# Patient Record
Sex: Female | Born: 1960
Health system: Southern US, Community
[De-identification: ages and names within clinical notes are randomized; demographics above are authoritative.]

## PROBLEM LIST (undated history)

## (undated) DIAGNOSIS — K5792 Diverticulitis of intestine, part unspecified, without perforation or abscess without bleeding: Secondary | ICD-10-CM

## (undated) DIAGNOSIS — K219 Gastro-esophageal reflux disease without esophagitis: Secondary | ICD-10-CM

## (undated) DIAGNOSIS — K589 Irritable bowel syndrome without diarrhea: Secondary | ICD-10-CM

## (undated) DIAGNOSIS — L508 Other urticaria: Secondary | ICD-10-CM

## (undated) DIAGNOSIS — R091 Pleurisy: Secondary | ICD-10-CM

## (undated) DIAGNOSIS — R569 Unspecified convulsions: Secondary | ICD-10-CM

## (undated) HISTORY — PX: HYSTEROTOMY: SHX1776

## (undated) HISTORY — PX: TIBIA FRACTURE SURGERY: SHX806

## (undated) HISTORY — DX: Other urticaria: L50.8

## (undated) HISTORY — PX: PARTIAL HYSTERECTOMY: SHX80

## (undated) HISTORY — DX: Unspecified convulsions: R56.9

---

## 2009-05-13 ENCOUNTER — Encounter: Payer: Self-pay | Admitting: Gastroenterology

## 2009-05-14 ENCOUNTER — Encounter: Payer: Self-pay | Admitting: Gastroenterology

## 2009-05-14 HISTORY — PX: COLONOSCOPY: SHX174

## 2009-05-14 HISTORY — PX: ESOPHAGOGASTRODUODENOSCOPY: SHX1529

## 2016-05-09 ENCOUNTER — Emergency Department (HOSPITAL_COMMUNITY): Payer: Managed Care, Other (non HMO)

## 2016-05-09 ENCOUNTER — Encounter (HOSPITAL_COMMUNITY): Payer: Self-pay

## 2016-05-09 ENCOUNTER — Emergency Department (HOSPITAL_COMMUNITY)
Admission: EM | Admit: 2016-05-09 | Discharge: 2016-05-09 | Disposition: A | Discharge status: Home / Self Care | Payer: Managed Care, Other (non HMO) | Attending: Emergency Medicine | Admitting: Emergency Medicine

## 2016-05-09 DIAGNOSIS — R1031 Right lower quadrant pain: Secondary | ICD-10-CM | POA: Diagnosis not present

## 2016-05-09 DIAGNOSIS — R109 Unspecified abdominal pain: Secondary | ICD-10-CM

## 2016-05-09 DIAGNOSIS — Z7982 Long term (current) use of aspirin: Secondary | ICD-10-CM | POA: Insufficient documentation

## 2016-05-09 DIAGNOSIS — R1032 Left lower quadrant pain: Secondary | ICD-10-CM | POA: Insufficient documentation

## 2016-05-09 DIAGNOSIS — R05 Cough: Secondary | ICD-10-CM | POA: Insufficient documentation

## 2016-05-09 DIAGNOSIS — Z791 Long term (current) use of non-steroidal anti-inflammatories (NSAID): Secondary | ICD-10-CM | POA: Insufficient documentation

## 2016-05-09 DIAGNOSIS — F17210 Nicotine dependence, cigarettes, uncomplicated: Secondary | ICD-10-CM | POA: Diagnosis not present

## 2016-05-09 HISTORY — DX: Irritable bowel syndrome without diarrhea: K58.9

## 2016-05-09 HISTORY — DX: Gastro-esophageal reflux disease without esophagitis: K21.9

## 2016-05-09 HISTORY — DX: Diverticulitis of intestine, part unspecified, without perforation or abscess without bleeding: K57.92

## 2016-05-09 HISTORY — DX: Pleurisy: R09.1

## 2016-05-09 LAB — COMPREHENSIVE METABOLIC PANEL
ALT: 15 U/L (ref 14–54)
AST: 32 U/L (ref 15–41)
Albumin: 3.6 g/dL (ref 3.5–5.0)
Alkaline Phosphatase: 54 U/L (ref 38–126)
Anion gap: 6 (ref 5–15)
BUN: 11 mg/dL (ref 6–20)
CO2: 28 mmol/L (ref 22–32)
Calcium: 9.3 mg/dL (ref 8.9–10.3)
Chloride: 103 mmol/L (ref 101–111)
Creatinine, Ser: 0.62 mg/dL (ref 0.44–1.00)
GFR calc Af Amer: >60 mL/min (ref >60)
GFR calc non Af Amer: >60 mL/min (ref >60)
Glucose, Bld: 107 mg/dL — ABNORMAL HIGH (ref 65–99)
Potassium: 4.1 mmol/L (ref 3.5–5.1)
Sodium: 137 mmol/L (ref 135–145)
Total Bilirubin: 0.3 mg/dL (ref 0.3–1.2)
Total Protein: 6.6 g/dL (ref 6.5–8.1)

## 2016-05-09 LAB — LIPASE, BLOOD: Lipase: 40 U/L (ref 11–51)

## 2016-05-09 LAB — CBC WITH DIFFERENTIAL/PLATELET
Basophils Absolute: 0.1 10*3/uL (ref 0.0–0.1)
Basophils Relative: 1 %
Eosinophils Absolute: 0.4 10*3/uL (ref 0.0–0.7)
Eosinophils Relative: 5 %
HCT: 35.4 % — ABNORMAL LOW (ref 36.0–46.0)
Hemoglobin: 12 g/dL (ref 12.0–15.0)
Lymphocytes Relative: 39 %
Lymphs Abs: 2.9 10*3/uL (ref 0.7–4.0)
MCH: 29.9 pg (ref 26.0–34.0)
MCHC: 33.9 g/dL (ref 30.0–36.0)
MCV: 88.3 fL (ref 78.0–100.0)
Monocytes Absolute: 0.6 10*3/uL (ref 0.1–1.0)
Monocytes Relative: 9 %
Neutro Abs: 3.5 10*3/uL (ref 1.7–7.7)
Neutrophils Relative %: 46 %
Platelets: 326 10*3/uL (ref 150–400)
RBC: 4.01 MIL/uL (ref 3.87–5.11)
RDW: 13.9 % (ref 11.5–15.5)
WBC: 7.5 10*3/uL (ref 4.0–10.5)

## 2016-05-09 LAB — URINALYSIS, ROUTINE W REFLEX MICROSCOPIC
Bilirubin Urine: NEGATIVE
Glucose, UA: NEGATIVE mg/dL
Hgb urine dipstick: NEGATIVE
Ketones, ur: NEGATIVE mg/dL
Leukocytes, UA: NEGATIVE
Nitrite: NEGATIVE
Protein, ur: NEGATIVE mg/dL
Specific Gravity, Urine: 1.005 (ref 1.005–1.030)
pH: 5 (ref 5.0–8.0)

## 2016-05-09 LAB — I-STAT TROPONIN, ED: Troponin i, poc: 0 ng/mL (ref 0.00–0.08)

## 2016-05-09 LAB — D-DIMER, QUANTITATIVE: D-Dimer, Quant: 0.3 ug/mL-FEU (ref 0.00–0.50)

## 2016-05-09 MED ORDER — IBUPROFEN 800 MG PO TABS: 800.0000 mg | ORAL_TABLET | Freq: Three times a day (TID) | ORAL | 0 refills | Status: DC | PRN

## 2016-05-09 NOTE — ED Triage Notes (Addendum)
Pt reports left side flank pain x one week Feels likecramp/contraction of pain. Marland Kitchen. Has hx of pleusrisy. Intermittent cough. Has difficulty having BM. Denies urinary symptoms. Reports increase belching and reflux

## 2016-05-09 NOTE — ED Notes (Signed)
Pt returned from X-ray.  

## 2016-05-09 NOTE — ED Provider Notes (Signed)
[facility] Provider Note   CSN: [id number] Arrival date & time: 05/09/16  0636     History   Chief Complaint Chief Complaint  Patient presents with  . Flank Pain    HPI Tammy Flores is a 56 y.o. female.  HPI  56 year old female presents with left back/flank pain. She states that it has been present since yesterday morning. She has also had gas and reflux for about one week. However when she woke up yesterday morning her left flank was hurting. She's had a mild cough with mild productive sputum that she does not know the color for about 3 days. No fevers or shortness of breath. She feels like she might have a sinus infection as she's had drainage running down the back of her throat. However does not have anterior chest pain or shortness of breath. Nothing makes the pain better or worse including no pleuritic symptoms or exertional symptoms. The pain feels sharp. She has not noticed a rash. She has not noticed hematuria or dysuria. No nausea, vomiting, diarrhea, or constipation. She has felt a little bit of lower abdominal pain during this time as well. No leg swelling. Prior partial hysterectomy.  Past Medical History:  Diagnosis Date  . Diverticulitis   . GERD (gastroesophageal reflux disease)   . IBS (irritable bowel syndrome)   . Pleurisy     There are no active problems to display for this patient.   Past Surgical History:  Procedure Laterality Date  . HYSTEROTOMY     partial    OB History    Gravida Para Term Preterm AB Living   5 5           SAB TAB Ectopic Multiple Live Births                   Home Medications    Prior to Admission medications   Medication Sig Start Date End Date Taking? Authorizing Provider  aspirin EC 81 MG tablet Take 81 mg by mouth daily.   Yes Historical Provider, MD  cetirizine (EQ ALLERGY RELIEF, CETIRIZINE,) 10 MG tablet Take 10 mg by mouth daily.   Yes Historical Provider, MD  omeprazole (PRILOSEC OTC) 20 MG tablet Take 20 mg  by mouth daily.   Yes Historical Provider, MD  ibuprofen (ADVIL,MOTRIN) 800 MG tablet Take 1 tablet (800 mg total) by mouth every 8 (eight) hours as needed. 05/09/16   Pricilla Loveless, MD    Family History No family history on file.  Social History Social History  Substance Use Topics  . Smoking status: Current Every Day Smoker    Types: Cigarettes  . Smokeless tobacco: Never Used  . Alcohol use No     Allergies   Iodine; Food; and Shellfish allergy   Review of Systems Review of Systems  Constitutional: Negative for fever.  Respiratory: Positive for cough (mild). Negative for shortness of breath.   Cardiovascular: Negative for chest pain and leg swelling.  Gastrointestinal: Positive for abdominal pain. Negative for constipation, diarrhea, nausea and vomiting.  Genitourinary: Positive for flank pain. Negative for dysuria and hematuria.  Musculoskeletal: Positive for back pain.  All other systems reviewed and are negative.    Physical Exam Updated Vital Signs BP 124/84   Pulse 85   Temp 97.7 F (36.5 C) (Oral)   Resp 13   Ht 5\' 4"  (1.626 m)   Wt 180 lb (81.6 kg)   SpO2 95%   BMI 30.90 kg/m   Physical Exam  Constitutional: She is oriented to person, place, and time. She appears well-developed and well-nourished.  HENT:  Head: Normocephalic and atraumatic.  Right Ear: External ear normal.  Left Ear: External ear normal.  Nose: Nose normal.  Eyes: Right eye exhibits no discharge. Left eye exhibits no discharge.  Cardiovascular: Normal rate, regular rhythm and normal heart sounds.   Pulmonary/Chest: Effort normal and breath sounds normal. She has no wheezes. She has no rales.  No rash or tenderness over left lower back/chest  Abdominal: Soft. There is tenderness (mild) in the right lower quadrant, suprapubic area and left lower quadrant. There is no CVA tenderness.  Neurological: She is alert and oriented to person, place, and time.  Skin: Skin is warm and dry.    Nursing note and vitals reviewed.    ED Treatments / Results  Labs (all labs ordered are listed, but only abnormal results are displayed) Labs Reviewed  URINALYSIS, ROUTINE W REFLEX MICROSCOPIC - Abnormal; Notable for the following:       Result Value   Color, Urine STRAW (*)    All other components within normal limits  COMPREHENSIVE METABOLIC PANEL - Abnormal; Notable for the following:    Glucose, Bld 107 (*)    All other components within normal limits  CBC WITH DIFFERENTIAL/PLATELET - Abnormal; Notable for the following:    HCT 35.4 (*)    All other components within normal limits  LIPASE, BLOOD  D-DIMER, QUANTITATIVE (NOT AT [facility])  I-STAT TROPOININ, ED    EKG  EKG Interpretation  Date/Time:  Tuesday May 09 2016 08:06:17 EST Ventricular Rate:  80 PR Interval:    QRS Duration: 83 QT Interval:  398 QTC Calculation: 460 R Axis:   45 Text Interpretation:  Sinus rhythm Short PR interval Low voltage, precordial leads No old tracing to compare Confirmed by Eldena Dede MD, Tayron Hunnell [phone]) on 05/09/2016 8:28:07 AM       Radiology Dg Chest 2 View  Result Date: 05/09/2016 CLINICAL DATA:  Cough, left-sided rib discomfort. EXAM: CHEST  2 VIEW COMPARISON:  None in PACs FINDINGS: The lungs are adequately inflated. There is no focal infiltrate. There is no pleural effusion or pneumothorax. The heart and pulmonary vascularity are normal. The mediastinum is normal in width. There is mild degenerative disc disease at multiple mid and lower thoracic levels. The observed portions of the left ribs are normal. IMPRESSION: There is no acute cardiopulmonary abnormality. The observed portions of the left ribs exhibit no acute abnormalities. Electronically Signed   By: David  [place] M.D.   On: 05/09/2016 08:42   Ct Renal Stone Study  Result Date: 05/09/2016 CLINICAL DATA:  56 year old female with left-sided flank pain for the past week. History of pleurisy, gastroesophageal reflux, irritable  bowel syndrome and diverticulitis. Initial encounter. EXAM: CT ABDOMEN AND PELVIS WITHOUT CONTRAST TECHNIQUE: Multidetector CT imaging of the abdomen and pelvis was performed following the standard protocol without IV contrast. COMPARISON:  None. FINDINGS: Lower chest: Lung bases clear. Hepatobiliary: Taking into account limitation by non contrast imaging, no worrisome mass. Elongated liver spanning over 19.8 cm with fatty infiltration. No calcified gallstones. Pancreas: Taking into account limitation by non contrast imaging, no mass or inflammation. Spleen: Taking into account limitation by non contrast imaging, no mass or enlargement. Adrenals/Urinary Tract: No renal or ureteral obstructing stone or evidence hydronephrosis. Taking into account limitation by non contrast imaging, no renal or adrenal mass. Stomach/Bowel: No extra luminal bowel inflammatory process, free fluid or free air. Sigmoid diverticular with muscular  hypertrophy. Left ovary adjacent to the sigmoid colon. Under distended colon at the level of the hepatic flexure limits evaluation. Under distended stomach without gross abnormality. Vascular/Lymphatic: Mild vascular calcifications without aortic aneurysm. No adenopathy. Reproductive: Post hysterectomy. Other: none. Musculoskeletal: Degenerative changes lower thoracic and lumbar spine most notable lower lumbar region. IMPRESSION: No renal or ureteral obstructing stone or evidence hydronephrosis. No extra luminal bowel inflammatory process, free fluid or free air. Sigmoid diverticular with muscular hypertrophy. Under distended colon at the level of the hepatic flexure limits evaluation. Under distended stomach without gross abnormality. Aortic atherosclerosis. Degenerative changes lower thoracic lumbar spine most notable lower lumbar region. Elongated fatty liver. Electronically Signed   By: Lacy DuverneySteven  Olson M.D.   On: 05/09/2016 09:07    Procedures Procedures (including critical care  time)  Medications Ordered in ED Medications - No data to display   Initial Impression / Assessment and Plan / ED Course  I have reviewed the triage vital signs and the nursing notes.  Pertinent labs & imaging results that were available during my care of the patient were reviewed by me and considered in my medical decision making (see chart for details).  Clinical Course as of May 09 1726  Tue May 09, 2016  0739 Overall well appearing. No reproducible tenderness or rash. Given lower abd pain, will do CT stone study to r/o AAA, renal colic and diverticulitis. CXR for cough. Ddimer for low risk PE given no obvious cause at this time. She declines pain meds  [SG]    Clinical Course User Index [SG] Pricilla LovelessScott Denice Cardon, MD    No clear source for the side/flank pain. No tenderness. No rash. Has had zoster and felt different than this. Doubt PE, no dyspnea or pleuritic pain. Low risk with negative ddimer. Feels well. Given cough, possible pleurisy? Recommend nsaids, f/u with PCP. Discussed strict return precautions. No obvious source of abd pain, negative CT scan  Final Clinical Impressions(s) / ED Diagnoses   Final diagnoses:  Left flank pain    New Prescriptions Discharge Medication List as of 05/09/2016  9:48 AM    START taking these medications   Details  ibuprofen (ADVIL,MOTRIN) 800 MG tablet Take 1 tablet (800 mg total) by mouth every 8 (eight) hours as needed., Starting Tue 05/09/2016, Print         Pricilla LovelessScott Jeanann Balinski, MD 05/09/16 1729

## 2017-03-21 DIAGNOSIS — K210 Gastro-esophageal reflux disease with esophagitis: Secondary | ICD-10-CM | POA: Diagnosis not present

## 2017-03-21 DIAGNOSIS — E782 Mixed hyperlipidemia: Secondary | ICD-10-CM | POA: Diagnosis not present

## 2017-03-21 DIAGNOSIS — K5712 Diverticulitis of small intestine without perforation or abscess without bleeding: Secondary | ICD-10-CM | POA: Diagnosis not present

## 2017-03-21 DIAGNOSIS — J45909 Unspecified asthma, uncomplicated: Secondary | ICD-10-CM | POA: Diagnosis not present

## 2017-03-23 DIAGNOSIS — E782 Mixed hyperlipidemia: Secondary | ICD-10-CM | POA: Diagnosis not present

## 2017-03-28 DIAGNOSIS — L309 Dermatitis, unspecified: Secondary | ICD-10-CM | POA: Diagnosis not present

## 2017-04-20 DIAGNOSIS — K210 Gastro-esophageal reflux disease with esophagitis: Secondary | ICD-10-CM | POA: Diagnosis not present

## 2017-05-04 DIAGNOSIS — F419 Anxiety disorder, unspecified: Secondary | ICD-10-CM | POA: Diagnosis not present

## 2017-05-04 DIAGNOSIS — E782 Mixed hyperlipidemia: Secondary | ICD-10-CM | POA: Diagnosis not present

## 2017-05-04 DIAGNOSIS — Z6831 Body mass index (BMI) 31.0-31.9, adult: Secondary | ICD-10-CM | POA: Diagnosis not present

## 2017-05-04 DIAGNOSIS — L309 Dermatitis, unspecified: Secondary | ICD-10-CM | POA: Diagnosis not present

## 2017-05-04 DIAGNOSIS — R5383 Other fatigue: Secondary | ICD-10-CM | POA: Diagnosis not present

## 2017-05-04 DIAGNOSIS — K5712 Diverticulitis of small intestine without perforation or abscess without bleeding: Secondary | ICD-10-CM | POA: Diagnosis not present

## 2017-07-16 DIAGNOSIS — L03114 Cellulitis of left upper limb: Secondary | ICD-10-CM | POA: Diagnosis not present

## 2017-07-16 DIAGNOSIS — L309 Dermatitis, unspecified: Secondary | ICD-10-CM | POA: Diagnosis not present

## 2017-07-24 DIAGNOSIS — L03114 Cellulitis of left upper limb: Secondary | ICD-10-CM | POA: Diagnosis not present

## 2017-07-24 DIAGNOSIS — L239 Allergic contact dermatitis, unspecified cause: Secondary | ICD-10-CM | POA: Diagnosis not present

## 2017-07-31 DIAGNOSIS — L509 Urticaria, unspecified: Secondary | ICD-10-CM | POA: Diagnosis not present

## 2017-09-11 DIAGNOSIS — Z6831 Body mass index (BMI) 31.0-31.9, adult: Secondary | ICD-10-CM | POA: Diagnosis not present

## 2017-09-11 DIAGNOSIS — E782 Mixed hyperlipidemia: Secondary | ICD-10-CM | POA: Diagnosis not present

## 2017-09-11 DIAGNOSIS — E559 Vitamin D deficiency, unspecified: Secondary | ICD-10-CM | POA: Diagnosis not present

## 2017-09-11 DIAGNOSIS — D519 Vitamin B12 deficiency anemia, unspecified: Secondary | ICD-10-CM | POA: Diagnosis not present

## 2017-09-11 DIAGNOSIS — L03114 Cellulitis of left upper limb: Secondary | ICD-10-CM | POA: Diagnosis not present

## 2017-09-11 DIAGNOSIS — L509 Urticaria, unspecified: Secondary | ICD-10-CM | POA: Diagnosis not present

## 2017-09-11 DIAGNOSIS — J302 Other seasonal allergic rhinitis: Secondary | ICD-10-CM | POA: Diagnosis not present

## 2017-09-11 DIAGNOSIS — L239 Allergic contact dermatitis, unspecified cause: Secondary | ICD-10-CM | POA: Diagnosis not present

## 2017-09-13 DIAGNOSIS — N951 Menopausal and female climacteric states: Secondary | ICD-10-CM | POA: Diagnosis not present

## 2017-09-13 DIAGNOSIS — Z01419 Encounter for gynecological examination (general) (routine) without abnormal findings: Secondary | ICD-10-CM | POA: Diagnosis not present

## 2017-09-13 DIAGNOSIS — Z6832 Body mass index (BMI) 32.0-32.9, adult: Secondary | ICD-10-CM | POA: Diagnosis not present

## 2017-09-15 DIAGNOSIS — L509 Urticaria, unspecified: Secondary | ICD-10-CM | POA: Diagnosis not present

## 2017-09-15 DIAGNOSIS — D649 Anemia, unspecified: Secondary | ICD-10-CM | POA: Diagnosis not present

## 2017-09-15 DIAGNOSIS — E782 Mixed hyperlipidemia: Secondary | ICD-10-CM | POA: Diagnosis not present

## 2017-09-15 DIAGNOSIS — R7301 Impaired fasting glucose: Secondary | ICD-10-CM | POA: Diagnosis not present

## 2017-09-17 ENCOUNTER — Other Ambulatory Visit (HOSPITAL_COMMUNITY): Payer: Self-pay | Admitting: Internal Medicine

## 2017-09-17 DIAGNOSIS — R0989 Other specified symptoms and signs involving the circulatory and respiratory systems: Secondary | ICD-10-CM

## 2017-09-17 DIAGNOSIS — F17200 Nicotine dependence, unspecified, uncomplicated: Secondary | ICD-10-CM

## 2017-09-17 DIAGNOSIS — E785 Hyperlipidemia, unspecified: Secondary | ICD-10-CM

## 2017-09-24 ENCOUNTER — Ambulatory Visit (HOSPITAL_COMMUNITY)
Admission: RE | Admit: 2017-09-24 | Discharge: 2017-09-24 | Disposition: A | Discharge status: Home / Self Care | Payer: BLUE CROSS/BLUE SHIELD | Source: Ambulatory Visit | Attending: Internal Medicine | Admitting: Internal Medicine

## 2017-09-24 ENCOUNTER — Other Ambulatory Visit (HOSPITAL_COMMUNITY): Payer: Self-pay | Admitting: Internal Medicine

## 2017-09-24 DIAGNOSIS — I6523 Occlusion and stenosis of bilateral carotid arteries: Secondary | ICD-10-CM | POA: Insufficient documentation

## 2017-09-24 DIAGNOSIS — Z136 Encounter for screening for cardiovascular disorders: Secondary | ICD-10-CM | POA: Insufficient documentation

## 2017-09-24 DIAGNOSIS — R0989 Other specified symptoms and signs involving the circulatory and respiratory systems: Secondary | ICD-10-CM

## 2017-09-24 DIAGNOSIS — E785 Hyperlipidemia, unspecified: Secondary | ICD-10-CM

## 2017-09-24 DIAGNOSIS — F17200 Nicotine dependence, unspecified, uncomplicated: Secondary | ICD-10-CM | POA: Diagnosis not present

## 2017-09-24 DIAGNOSIS — Z882 Allergy status to sulfonamides status: Secondary | ICD-10-CM | POA: Insufficient documentation

## 2017-09-24 DIAGNOSIS — I6521 Occlusion and stenosis of right carotid artery: Secondary | ICD-10-CM | POA: Diagnosis not present

## 2017-09-24 DIAGNOSIS — F17210 Nicotine dependence, cigarettes, uncomplicated: Secondary | ICD-10-CM | POA: Diagnosis not present

## 2017-09-27 IMAGING — CT CT RENAL STONE PROTOCOL
2 of 4 series · 16 of 46 positions shown, 18 images · non-contrast
Comparison: None.

CLINICAL DATA: 55-year-old female with left-sided flank pain for
the past week. History of pleurisy, gastroesophageal reflux,
irritable bowel syndrome and diverticulitis. Initial encounter.

EXAM:
CT ABDOMEN AND PELVIS WITHOUT CONTRAST
TECHNIQUE: Multidetector CT imaging of the abdomen and pelvis was performed
following the standard protocol without IV contrast.

[Series 2: axial st · axial · 0.74mm/px · z∈[+827,+1232]mm · 13 of 89 slices shown, 15 images]
[im 4/89  soft-tissue]
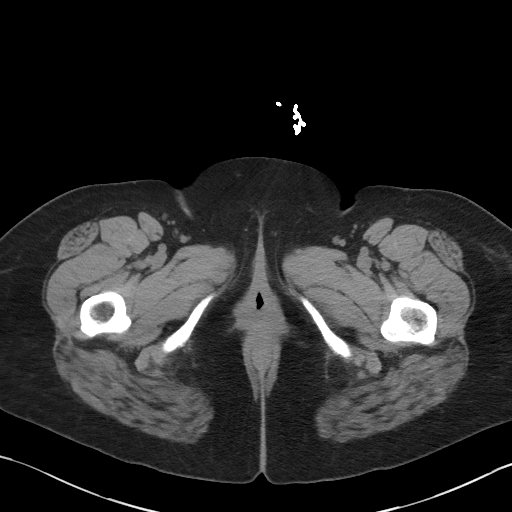
[im 4/89  bone]
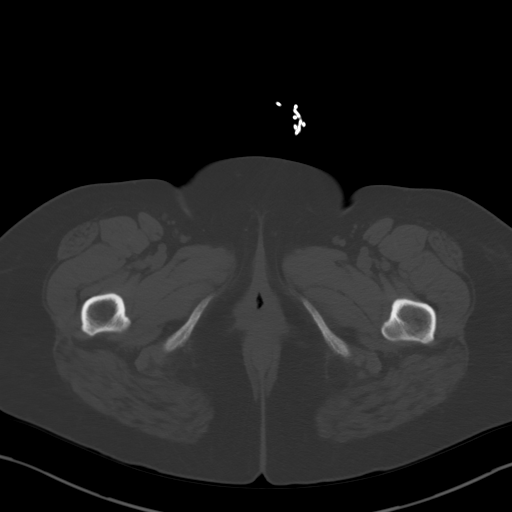
[im 12/89  soft-tissue]
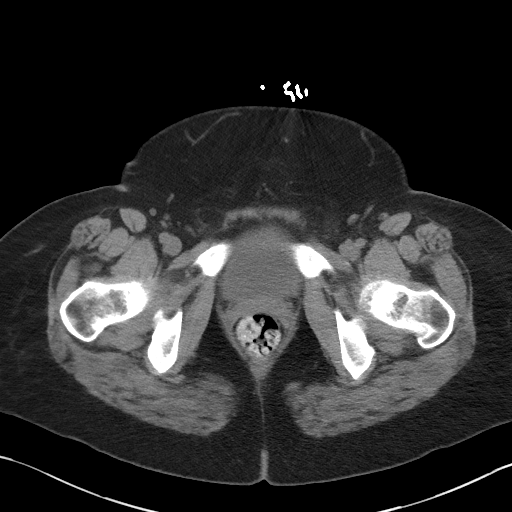
[im 20/89  soft-tissue]
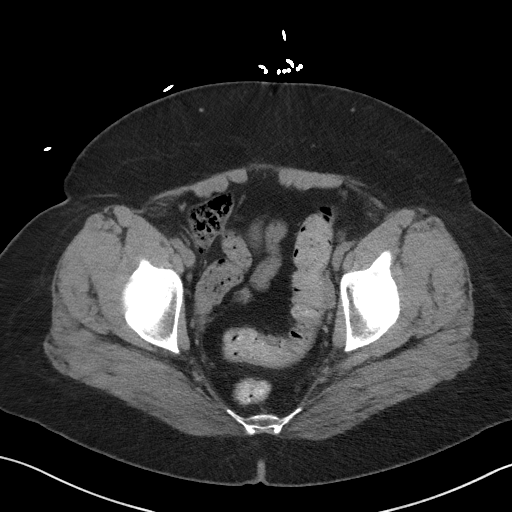
[im 23/89  soft-tissue]
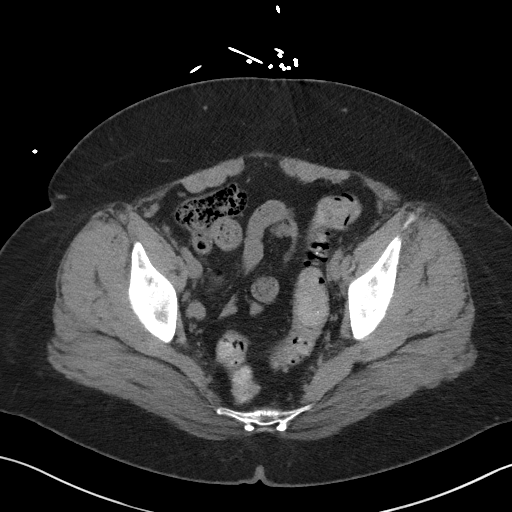
[im 31/89  soft-tissue]
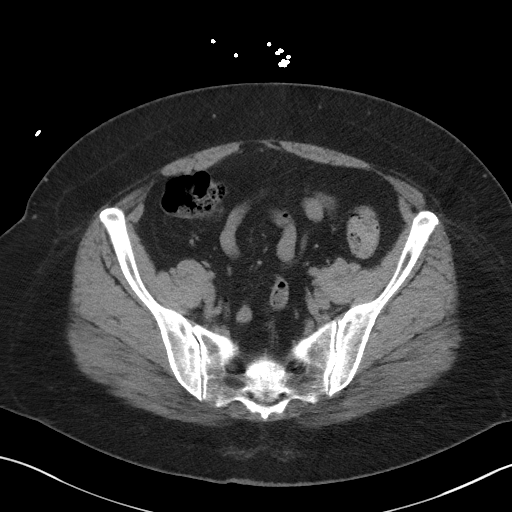
[im 39/89  soft-tissue]
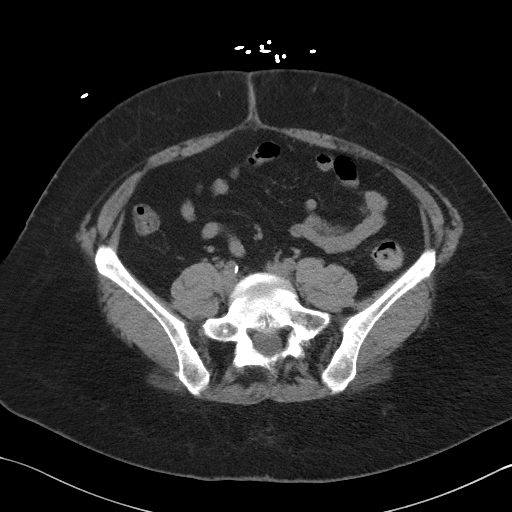
[im 46/89  soft-tissue]
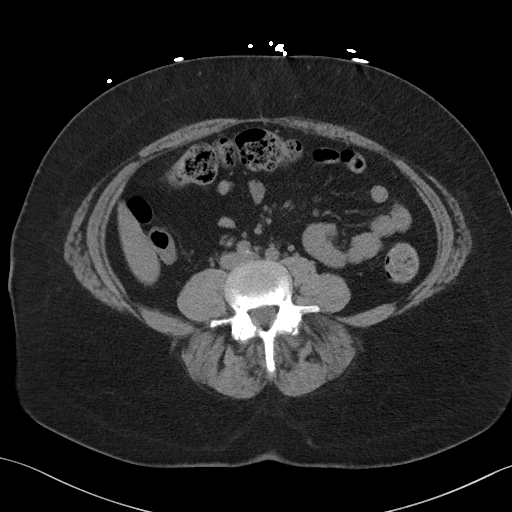
[im 50/89  soft-tissue]
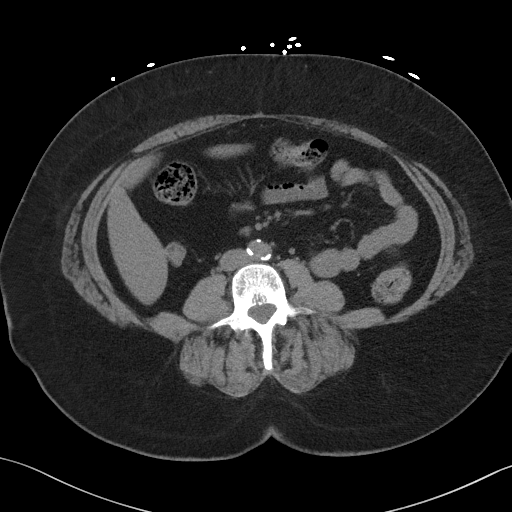
[im 58/89  soft-tissue]
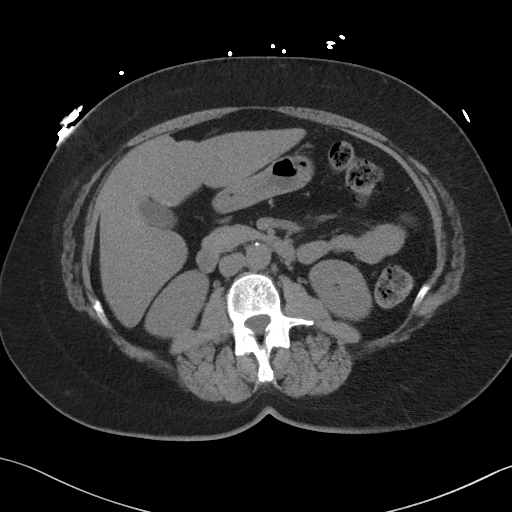
[im 58/89  bone]
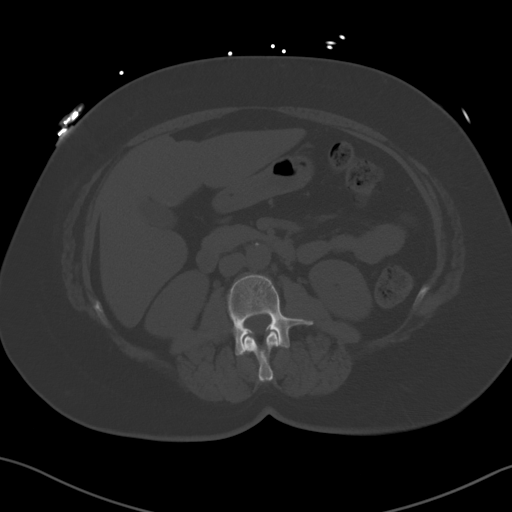
[im 66/89  soft-tissue]
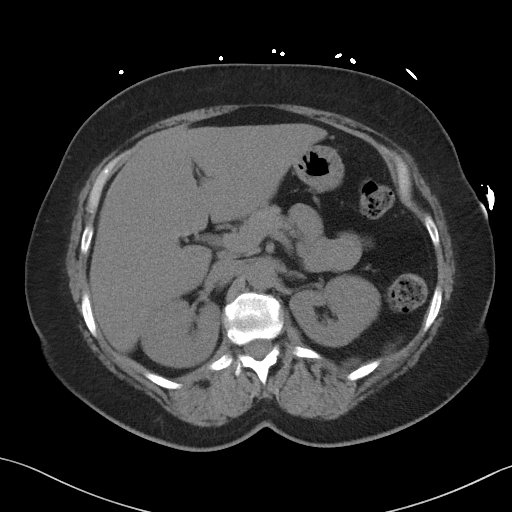
[im 69/89  soft-tissue]
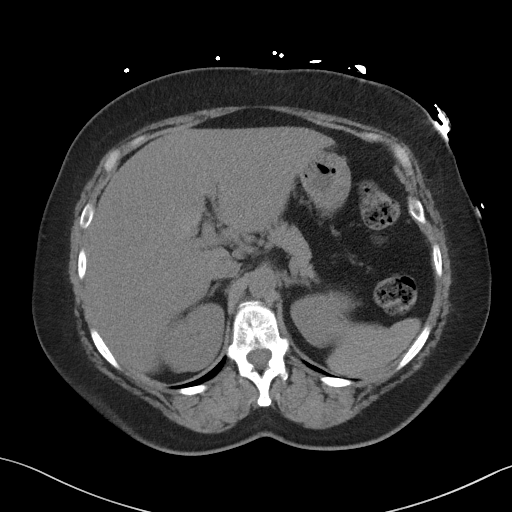
[im 77/89  soft-tissue]
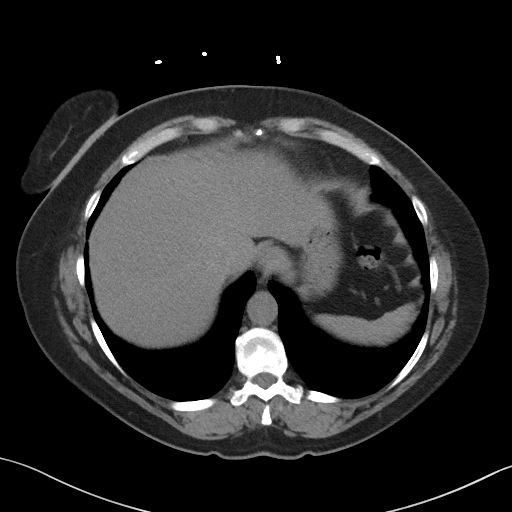
[im 85/89  soft-tissue]
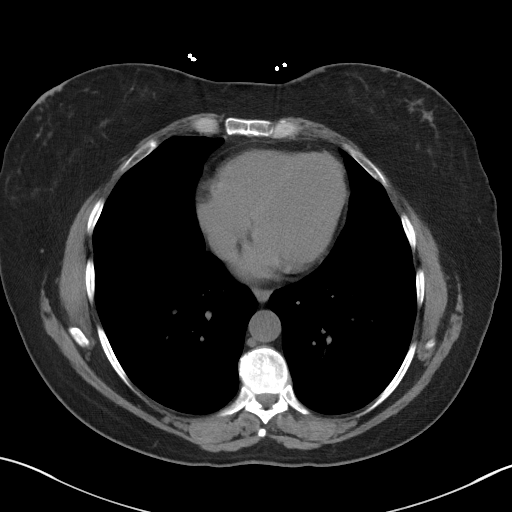

[Series 3: coronal st · coronal · 0.73mm/px · 3 of 101 slices shown]
[im 34/101  soft-tissue]
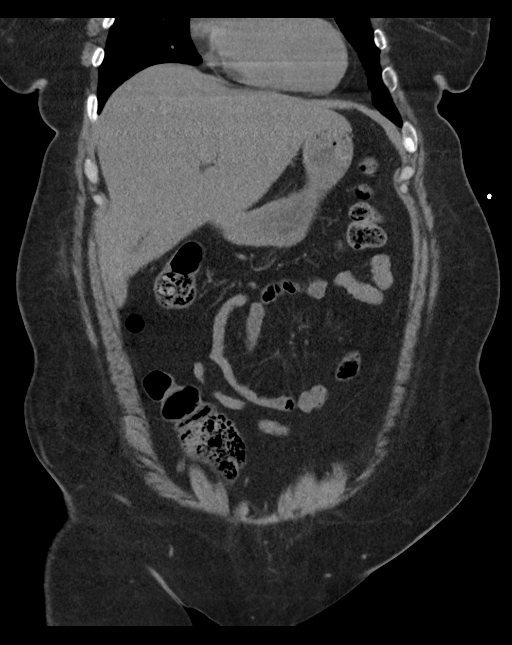
[im 45/101  soft-tissue]
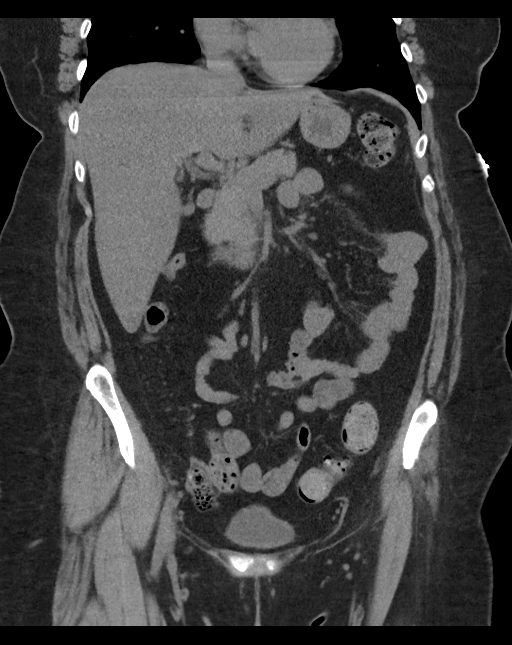
[im 56/101  soft-tissue]
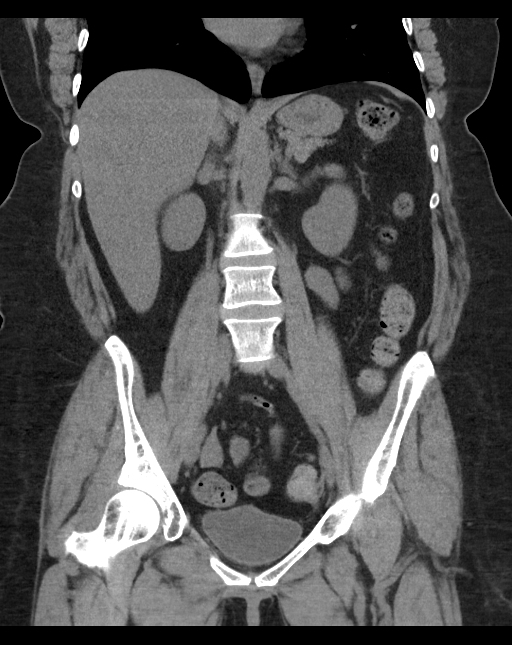

[16 of 46 positions shown; findings below may reference images not displayed]

FINDINGS: Lower chest: Lung bases clear.

Hepatobiliary: Taking into account limitation by non contrast
imaging, no worrisome mass. Elongated liver spanning over 19.8 cm
with fatty infiltration. No calcified gallstones.

Pancreas: Taking into account limitation by non contrast imaging, no
mass or inflammation.

Spleen: Taking into account limitation by non contrast imaging, no
mass or enlargement.

Adrenals/Urinary Tract: No renal or ureteral obstructing stone or
evidence hydronephrosis. Taking into account limitation by non
contrast imaging, no renal or adrenal mass.

Stomach/Bowel: No extra luminal bowel inflammatory process, free
fluid or free air.

Sigmoid diverticular with muscular hypertrophy. Left ovary adjacent
to the sigmoid colon.

Under distended colon at the level of the hepatic flexure limits
evaluation.

Under distended stomach without gross abnormality.

Vascular/Lymphatic: Mild vascular calcifications without aortic
aneurysm. No adenopathy.

Reproductive: Post hysterectomy.

Other: none.

Musculoskeletal: Degenerative changes lower thoracic and lumbar
spine most notable lower lumbar region.
IMPRESSION: No renal or ureteral obstructing stone or evidence hydronephrosis.

No extra luminal bowel inflammatory process, free fluid or free air.

Sigmoid diverticular with muscular hypertrophy.

Under distended colon at the level of the hepatic flexure limits
evaluation.

Under distended stomach without gross abnormality.

Aortic atherosclerosis.

Degenerative changes lower thoracic lumbar spine most notable lower
lumbar region.

Elongated fatty liver.

## 2017-10-25 DIAGNOSIS — H1033 Unspecified acute conjunctivitis, bilateral: Secondary | ICD-10-CM | POA: Diagnosis not present

## 2017-10-25 DIAGNOSIS — H16223 Keratoconjunctivitis sicca, not specified as Sjogren's, bilateral: Secondary | ICD-10-CM | POA: Diagnosis not present

## 2017-10-25 DIAGNOSIS — H2513 Age-related nuclear cataract, bilateral: Secondary | ICD-10-CM | POA: Diagnosis not present

## 2017-11-06 DIAGNOSIS — H1013 Acute atopic conjunctivitis, bilateral: Secondary | ICD-10-CM | POA: Diagnosis not present

## 2017-11-20 ENCOUNTER — Ambulatory Visit: Payer: BLUE CROSS/BLUE SHIELD | Admitting: Allergy & Immunology

## 2017-11-20 ENCOUNTER — Encounter: Payer: Self-pay | Admitting: Allergy & Immunology

## 2017-11-20 VITALS — BP 128/76 | HR 75 | Temp 98.4°F | Resp 18 | Ht 62.99 in | Wt 189.4 lb

## 2017-11-20 DIAGNOSIS — L508 Other urticaria: Secondary | ICD-10-CM

## 2017-11-20 MED ORDER — METHYLPREDNISOLONE ACETATE 40 MG/ML IJ SUSP
40.0000 mg | Freq: Once | INTRAMUSCULAR | Status: AC
  Administered 2017-11-20: 40 mg via INTRAMUSCULAR

## 2017-11-20 MED ORDER — MONTELUKAST SODIUM 10 MG PO TABS: 10.0000 mg | ORAL_TABLET | Freq: Every day | ORAL | 5 refills | Status: DC

## 2017-11-20 NOTE — Patient Instructions (Addendum)
1. Chronic urticaria - Your history is concerning with your joint pains and report of pain in your feet (as opposed to just itching). - Your complete metabolic count and complete blood count.  - We will get some labs to rule out serious causes of hives: tryptase level, chronic urticaria panel, anti-thyroid antibodies, ESR, and CRP. - We will also get an environmental allergy panel to look for allergic triggers. - Take notes of foods that might be causing these symptoms. - Chronic hives are often times a self limited process and will "burn themselves out" over 6-12 months, although this is not always the case.  - In the meantime, start suppressive dosing of antihistamines:   - Morning: Xyzal (levocetirizine) 5-33m (one or two tablets)  - Evening: Xyzal (levocetirizine) 5-138m(one or two tablets) + Singulair (montelukast) 1036m You can change this dosing at home, decreasing the dose as needed or increasing the dosing as needed.  - If you are not tolerating the medications or are tired of taking them every day, we can start treatment with a monthly injectable medication called Xolair.   2. Return in about 1 month (around 12/18/2017).   Please inform us [place] any Emergency Department visits, hospitalizations, or changes in symptoms. Call us [place] going to the ED for breathing or allergy symptoms since we might be able to fit you in for a sick visit. Feel free to contact us [place] with any questions, problems, or concerns.  It was a pleasure to meet you today!  Websites that have reliable patient information: 1. American Academy of Asthma, Allergy, and Immunology: www.aaaai.org 2. Food Allergy Research and Education (FARE): foodallergy.org 3. Mothers of Asthmatics: http://www.asthmacommunitynetwork.org 4. American College of Allergy, Asthma, and Immunology: wwwMonthlyElectricBill.co.ukMake sure you are registered to vote! If you have moved or changed any of your contact information, you will need to get  this updated before voting!

## 2017-11-20 NOTE — Progress Notes (Signed)
NEW PATIENT  Date of Service/Encounter:  11/20/17  Referring provider: Celene Squibb, MD   Assessment:   Chronic urticaria  Plan/Recommendations:   1. Chronic urticaria - Your history is concerning with your joint pains and report of pain in your feet (as opposed to just itching). - Your complete metabolic count and complete blood count.  - We will get some labs to rule out serious causes of hives: tryptase level, chronic urticaria panel, anti-thyroid antibodies, ESR, and CRP. - We will also get an environmental allergy panel to look for allergic triggers. - Take notes of foods that might be causing these symptoms. - Chronic hives are often times a self limited process and will "burn themselves out" over 6-12 months, although this is not always the case.  - In the meantime, start suppressive dosing of antihistamines:   - Morning: Xyzal (levocetirizine) 5-37m (one or two tablets)  - Evening: Xyzal (levocetirizine) 5-140m(one or two tablets) + Singulair (montelukast) 1061m You can change this dosing at home, decreasing the dose as needed or increasing the dosing as needed.  - If you are not tolerating the medications or are tired of taking them every day, we can start treatment with a monthly injectable medication called Xolair.   2. Return in about 1 month (around 12/18/2017).  Subjective:   Tammy Flores a 57 15o. female presenting today for evaluation of  Chief Complaint  Patient presents with  . Urticaria  . Pruritus    Tammy Flores a history of the following: There are no active problems to display for this patient.   History obtained from: chart review and patient.  Tammy Flores referred by HalCelene SquibbD.     Tammy Flores a 57 34o. female presenting for evaluation of hives. These have been ongoing for approximately one year.  It started when she had singles one year ago. She reports that she has had reactions to everything since that time. She reports that she has  urticaria that emerge on her feet and they hurt. She reports irritating itching over her entire foot and then they resolve. They will pop up somewhere else later and then they go away. She does endorse some joint pains with these but no fevers at all. She associates the joint pains with her age, but it should be noted that these did not precede the emergence of the hives.   There are no new exposures to her knowledge.  She is using soaps and detergents which are free of dyes. There is no association with any particular food. She denies tick bites and does eat a fair amount of red meat.   She has tried Benadryl without improvement. She also was put on Xyzal, which does not help. She was also placed on Pepcid without improvement. She did have blood work performed.  An alpha gal IgE was negative.  A complete metabolic panel was unremarkable.  The complete blood count demonstrated an absolute eosinophil count of only 200.  She also had thyroid function tested which was normal.   Otherwise, there is no history of other atopic diseases, including asthma, drug allergies, food allergies, environmental allergies, or stinging insect allergies. There is no significant infectious history. Vaccinations are up to date.    Past Medical History: There are no active problems to display for this patient.   Medication List:  Allergies as of 11/20/2017      Reactions   Iodine Anaphylaxis   Food Other (  See Comments)   SEAFOOD ALLERGY   Shellfish Allergy       Medication List        Accurate as of 11/20/17  1:26 PM. Always use your most recent med list.          aspirin EC 81 MG tablet Take 81 mg by mouth daily.   atorvastatin 20 MG tablet Commonly known as:  LIPITOR Take 20 mg by mouth daily.   b complex vitamins tablet Take 1 tablet by mouth daily.   estradiol 0.5 MG tablet Commonly known as:  ESTRACE Take 0.5 mg by mouth daily.   ibuprofen 800 MG tablet Commonly known as:  ADVIL,MOTRIN Take 1  tablet (800 mg total) by mouth every 8 (eight) hours as needed.   levocetirizine 5 MG tablet Commonly known as:  XYZAL   montelukast 10 MG tablet Commonly known as:  SINGULAIR Take 1 tablet (10 mg total) by mouth at bedtime.   moxifloxacin 0.5 % ophthalmic solution Commonly known as:  VIGAMOX INSTILL 1 DROP INTO EACH EYE 4 TIMES DAILY   olopatadine 0.1 % ophthalmic solution Commonly known as:  PATANOL INSTILL 1 DROP INTO EACH EYE TWICE DAILY   omega-3 acid ethyl esters 1 g capsule Commonly known as:  LOVAZA Take 2 capsules by mouth 2 (two) times daily.   omeprazole 20 MG capsule Commonly known as:  PRILOSEC   Vitamin D3 100000 UNIT/GM Powd Take by mouth.   XIIDRA 5 % Soln Generic drug:  Lifitegrast INSTILL 1 DROP TWICE DAILY INTO BOTH EYES       Birth History: non-contributory.   Developmental History: non-contributory.   Past Surgical History: Past Surgical History:  Procedure Laterality Date  . HYSTEROTOMY     partial  . TIBIA FRACTURE SURGERY Right      Family History: Family History  Problem Relation Age of Onset  . COPD Father   . Asthma Sister      Social History: [place]isha lives at home with her family.  She lives in a house.  There is tile in the main living areas and would with carpeting in the bedrooms.  They have gas and electric heating.  They have central cooling.  There is one cat one dog in the home.  There are dust mite covers on the bed as well as the pillows.  There is tobacco exposure.  She currently works in a [employer].  She is a current smoker, smoking around half pack per day.    Review of Systems: a 14-point review of systems is pertinent for what is mentioned in HPI.  Otherwise, all other systems were negative. Constitutional: negative other than that listed in the HPI Eyes: negative other than that listed in the HPI Ears, nose, mouth, throat, and face: negative other than that listed in the HPI Respiratory: negative other than  that listed in the HPI Cardiovascular: negative other than that listed in the HPI Gastrointestinal: negative other than that listed in the HPI Genitourinary: negative other than that listed in the HPI Integument: negative other than that listed in the HPI Hematologic: negative other than that listed in the HPI Musculoskeletal: negative other than that listed in the HPI Neurological: negative other than that listed in the HPI Allergy/Immunologic: negative other than that listed in the HPI    Objective:   Blood pressure 128/76, pulse 75, temperature 98.4 F (36.9 C), temperature source Oral, resp. rate 18, height 5' 2.99" (1.6 m), weight 189 lb 6.4 oz (85.9  kg), SpO2 97 %. Body mass index is 33.56 kg/m.   Physical Exam:  General: Alert, interactive, in no acute distress. Pleasant female.  Eyes: No conjunctival injection bilaterally, no discharge on the right, no discharge on the left and no Horner-Trantas dots present. PERRL bilaterally. EOMI without pain. No photophobia.  Ears: Right TM pearly gray with normal light reflex, Left TM pearly gray with normal light reflex, Right TM intact without perforation and Left TM intact without perforation.  Nose/Throat: External nose within normal limits and septum midline. Turbinates edematous with clear discharge. Posterior oropharynx erythematous without cobblestoning in the posterior oropharynx. Tonsils 2+ without exudates.  Tongue without thrush. Neck: Supple without thyromegaly. Trachea midline. Adenopathy: no enlarged lymph nodes appreciated in the anterior cervical, occipital, axillary, epitrochlear, inguinal, or popliteal regions. Lungs: Clear to auscultation without wheezing, rhonchi or rales. No increased work of breathing. CV: Normal S1/S2. No murmurs. Capillary refill <2 seconds.  Abdomen: Nondistended, nontender. No guarding or rebound tenderness. Bowel sounds present in all fields and hypoactive  Skin: Dry, erythematous, excoriated  patches on the bilateral arms. Dermatographism present. Extremities:  No clubbing, cyanosis or edema. Neuro:   Grossly intact. No focal deficits appreciated. Responsive to questions.  Diagnostic studies: none       Salvatore Marvel, MD [facility]

## 2017-11-24 LAB — ALLERGEN PROFILE, MOLD
Alternaria Alternata IgE: <0.1 kU/L
Aspergillus Fumigatus IgE: <0.1 kU/L
Aureobasidi Pullulans IgE: <0.1 kU/L
Candida Albicans IgE: 0.15 kU/L — AB
Cladosporium Herbarum IgE: <0.1 kU/L
M009-IgE Fusarium proliferatum: <0.1 kU/L
M014-IgE Epicoccum purpur: <0.1 kU/L
Mucor Racemosus IgE: <0.1 kU/L
Penicillium Chrysogen IgE: <0.1 kU/L
Phoma Betae IgE: <0.1 kU/L
Setomelanomma Rostrat: <0.1 kU/L
Stemphylium Herbarum IgE: <0.1 kU/L

## 2017-11-28 LAB — SEDIMENTATION RATE: Sed Rate: 6 mm/hr (ref 0–40)

## 2017-11-28 LAB — ANA: Anti Nuclear Antibody(ANA): NEGATIVE

## 2017-11-28 LAB — IGE+ALLERGENS ZONE 2(30)
Alternaria Alternata IgE: <0.1 kU/L
Amer Sycamore IgE Qn: <0.1 kU/L
Aspergillus Fumigatus IgE: <0.1 kU/L
Bahia Grass IgE: <0.1 kU/L
Bermuda Grass IgE: <0.1 kU/L
Cat Dander IgE: <0.1 kU/L
Cedar, Mountain IgE: <0.1 kU/L
Cladosporium Herbarum IgE: <0.1 kU/L
Cockroach, American IgE: 0.25 kU/L — AB
Common Silver Birch IgE: <0.1 kU/L
D Farinae IgE: 0.79 kU/L — AB
D Pteronyssinus IgE: 0.82 kU/L — AB
Dog Dander IgE: <0.1 kU/L
Elm, American IgE: <0.1 kU/L
Hickory, White IgE: <0.1 kU/L
IgE (Immunoglobulin E), Serum: 358 IU/mL (ref 6–495)
Johnson Grass IgE: <0.1 kU/L
Maple/Box Elder IgE: <0.1 kU/L
Mucor Racemosus IgE: <0.1 kU/L
Mugwort IgE Qn: <0.1 kU/L
Nettle IgE: <0.1 kU/L
Oak, White IgE: <0.1 kU/L
Penicillium Chrysogen IgE: <0.1 kU/L
Pigweed, Rough IgE: <0.1 kU/L
Plantain, English IgE: <0.1 kU/L
Ragweed, Short IgE: <0.1 kU/L
Sheep Sorrel IgE Qn: <0.1 kU/L
Stemphylium Herbarum IgE: <0.1 kU/L
Sweet gum IgE RAST Ql: <0.1 kU/L
Timothy Grass IgE: <0.1 kU/L
White Mulberry IgE: <0.1 kU/L

## 2017-11-28 LAB — THYROID ANTIBODIES
Thyroglobulin Antibody: <1 IU/mL (ref 0.0–0.9)
Thyroperoxidase Ab SerPl-aCnc: 23 IU/mL (ref 0–34)

## 2017-11-28 LAB — TRYPTASE: Tryptase: 8.8 ug/L (ref 2.2–13.2)

## 2017-11-28 LAB — CHRONIC URTICARIA: cu index: 1.6 (ref <10)

## 2017-11-28 LAB — C-REACTIVE PROTEIN: CRP: 9 mg/L (ref 0–10)

## 2017-11-30 ENCOUNTER — Telehealth: Payer: Self-pay

## 2017-11-30 ENCOUNTER — Other Ambulatory Visit: Payer: Self-pay | Admitting: *Deleted

## 2017-11-30 MED ORDER — LEVOCETIRIZINE DIHYDROCHLORIDE 5 MG PO TABS: 5.0000 mg | ORAL_TABLET | Freq: Every evening | ORAL | 5 refills | Status: DC

## 2017-11-30 MED ORDER — MONTELUKAST SODIUM 10 MG PO TABS: 10.0000 mg | ORAL_TABLET | Freq: Every day | ORAL | 5 refills | Status: AC

## 2017-11-30 NOTE — Telephone Encounter (Signed)
Patient went to the pharmacy and the prescription for montelukast was put back so she is asking if it can be resent in. She is also wanting a refill on the xyzal.   [facility] in [place]

## 2017-11-30 NOTE — Telephone Encounter (Signed)
Prescriptions has been sent in. I called the patient and informed her of the medications and the treatment therapy outlined by Dr. Dellis AnesGallagher.

## 2017-12-03 MED ORDER — LEVOCETIRIZINE DIHYDROCHLORIDE 5 MG PO TABS: ORAL_TABLET | ORAL | 5 refills | Status: AC

## 2017-12-03 NOTE — Telephone Encounter (Signed)
Patient is calling because she is unable to pick up her prescription due to the instructions on the prescriptions not stating why she needs 120 days worth of xyzal.  Here is Dr Nino GlowGallaghers Note from the visit on 11/20/17.     In the meantime, start suppressive dosing of antihistamines:              - Morning: Xyzal (levocetirizine) 5-10mg  (one or two tablets)             - Evening: Xyzal (levocetirizine) 5-10mg  (one or two tablets) + Singulair (montelukast) 10mg 

## 2017-12-03 NOTE — Telephone Encounter (Signed)
Prescription has been sent again reflecting correct instructions and reason for 120 tabs to be dispensed. I did leave a message advising of new prescription.

## 2018-01-03 ENCOUNTER — Telehealth: Payer: Self-pay | Admitting: Allergy & Immunology

## 2018-01-03 NOTE — Telephone Encounter (Signed)
Pt called and said that every month her ins will not cover the xyzal for 4 times a day and we have to send for auth. She needs us to see if we can get xyzal cover. [facility] [place] [phone].

## 2018-01-03 NOTE — Telephone Encounter (Signed)
Left message advising to get Xyzal OTC

## 2018-01-04 NOTE — Telephone Encounter (Signed)
Spoke with patient regarding her Xyzal regimen and according to her insurance they will not cover the regimen and will need to get OTC. Patient stated she is not doing Xyzal and is doing zyrtec 4 times daily since its cheaper. Patient states she cannot afford OTC xyzal and is concern due to her whelps she developed on her shoulders which are causing pain. Patient was advise by Dr. Delorse LekPadgett to include Pepcid or Ranitidine to her regimen to help with the whelps. Patient stated she has tried this in the past with her PCP and it did not help. She stated she received Depomedrol injection at her last visit and it helped for 2 weeks and wanted to get more injections. She is needing something else regarding her sx, but advise she may have to be seen regarding her sx. Dr. Dellis AnesGallagher please advise on current situation and if patient can be seen.

## 2018-01-15 NOTE — Telephone Encounter (Signed)
Did we ever resolve this? We could bring her in for a visit to discuss options.  Tammy Bonds, MD [facility]

## 2018-01-15 NOTE — Telephone Encounter (Signed)
Called and informed patient that Dr. Dellis Anes would like to have her seen in clinic. She has an OV for 01/24/18 at 10:15am.

## 2018-01-15 NOTE — Telephone Encounter (Signed)
Noted. Thanks for taking care of that!   Kimyatta Lecy, MD [facility]   

## 2018-01-24 ENCOUNTER — Ambulatory Visit: Payer: BLUE CROSS/BLUE SHIELD | Admitting: Allergy & Immunology

## 2018-01-24 DIAGNOSIS — E559 Vitamin D deficiency, unspecified: Secondary | ICD-10-CM | POA: Diagnosis not present

## 2018-01-24 DIAGNOSIS — R7301 Impaired fasting glucose: Secondary | ICD-10-CM | POA: Diagnosis not present

## 2018-01-24 DIAGNOSIS — D519 Vitamin B12 deficiency anemia, unspecified: Secondary | ICD-10-CM | POA: Diagnosis not present

## 2018-01-24 DIAGNOSIS — E782 Mixed hyperlipidemia: Secondary | ICD-10-CM | POA: Diagnosis not present

## 2018-01-24 DIAGNOSIS — D649 Anemia, unspecified: Secondary | ICD-10-CM | POA: Diagnosis not present

## 2018-01-30 ENCOUNTER — Ambulatory Visit: Payer: BLUE CROSS/BLUE SHIELD | Admitting: Allergy & Immunology

## 2018-02-04 DIAGNOSIS — E782 Mixed hyperlipidemia: Secondary | ICD-10-CM | POA: Diagnosis not present

## 2018-02-04 DIAGNOSIS — L509 Urticaria, unspecified: Secondary | ICD-10-CM | POA: Diagnosis not present

## 2018-02-04 DIAGNOSIS — D519 Vitamin B12 deficiency anemia, unspecified: Secondary | ICD-10-CM | POA: Diagnosis not present

## 2018-02-04 DIAGNOSIS — D649 Anemia, unspecified: Secondary | ICD-10-CM | POA: Diagnosis not present

## 2018-02-20 ENCOUNTER — Encounter: Payer: Self-pay | Admitting: Allergy & Immunology

## 2018-02-20 ENCOUNTER — Ambulatory Visit: Payer: BLUE CROSS/BLUE SHIELD | Admitting: Allergy & Immunology

## 2018-02-20 VITALS — BP 124/70 | HR 86 | Resp 18

## 2018-02-20 DIAGNOSIS — L508 Other urticaria: Secondary | ICD-10-CM

## 2018-02-20 MED ORDER — FEXOFENADINE HCL 180 MG PO TABS: 360.0000 mg | ORAL_TABLET | Freq: Every day | ORAL | 5 refills | Status: DC

## 2018-02-20 NOTE — Progress Notes (Signed)
FOLLOW UP  Date of Service/Encounter:  02/20/18   Assessment:   Chronic idiopathic urticaria  Perennial allergic rhinitis (dust mites, cockroach, Candida albicans)   Tammy Flores presents with continued episodes of urticaria with angioedema.  At this point, with the Xyzal on board, she mostly has the angioedema.  However, she continuously itches despite the lack of the rash.  She continues to think that this is related to foods, although at the last visit nothing foods were implicated.  In any case, I did offer to send testing for the most common foods as well as an alpha gal panel.  She declined, however since her last lab bill was around $1000.  I think it is reasonable to avoid red meats for a couple weeks to see if this provides any help.  We are also going to add on Allegra 1 to 2 tablets nightly in addition to her current regimen.  I think that Xolair would be an excellent option for her, but she declined to get a sample today. She would rather read about it and talk to her husband first.   Plan/Recommendations:   1. Chronic urticaria - with essentially normal workup - With the lack of complete improvement with the antihistamines, we need to advance treatment to Xolair.  - Information provided and Tammy will reach out to you to discuss the approval process.  - Chronic hives are often times a self limited process and will "burn themselves out" over 12-18 months, although this is not always the case.  - Try avoiding pork and other read meats for a couple of weeks to see if this helps with the frequency of the hives.  - In the meantime, we will make some recommend some changes to antihistamines:   - Morning: Allegra (fexofenadine) 180-360mg  (one or two tablets)    - Evening: Xyzal (levocetirizine) 5-10mg  (one or two tablets) + Singulair (montelukast) 10mg  - You can change this dosing at home, decreasing the dose as needed or increasing the dosing as needed.   2. Return in about 3 months  (around 05/23/2018).  Subjective:   Tammy Flores is a 57 y.o. female presenting today for follow up of  Chief Complaint  Patient presents with  . Urticaria    WORSENING SYMPTOMS. CURRENT MEDS ARE NOT WORKING. DEVELOPING IN MOUTH, EARS, CHIN.     Tammy Flores has a history of the following: There are no active problems to display for this patient.   History obtained from: chart review and patient.  Tammy Flores's Primary Care Provider is Tammy Stabile, MD.     Tammy Flores is a 57 y.o. female presenting for a follow up visit.  She was last seen in August 2019 as a new patient.  At that time, she was endorsing chronic urticaria.  She also had a joy a pain in her feet, which was concerning.  We obtain some lab work to rule out serious causes of hives, including inflammatory markers.  All of this was thankfully normal.  She had some very mild elevated IgE to Candida albicans, cockroach, and dust mites.  We placed her on Xyzal 5 to 10 mg twice daily as well as Singulair 10 mg daily.  Call in August with worsening hives.  At that time, Dr. Delorse Lek recommended adding an H2 blocker.   Since the last visit, she has continued to have problems with the angioedema. The rash itself is gone but the angioedema has continued. She tells me that she has "hives  in [her] throat". She thinks that this is related to a food now, which is different than when I first saw her. However, she tolerates all of the major food allergens without adverse event. We did not do an alpha gal panel or a food allergy test at the last visit. She does have an EpiPen prescription in case this is needed.   Otherwise, there have been no changes to her past medical history, surgical history, family history, or social history.    Review of Systems: a 14-point review of systems is pertinent for what is mentioned in HPI.  Otherwise, all other systems were negative.  Constitutional: negative other than that listed in the HPI Eyes: negative other than  that listed in the HPI Ears, nose, mouth, throat, and face: negative other than that listed in the HPI Respiratory: negative other than that listed in the HPI Cardiovascular: negative other than that listed in the HPI Gastrointestinal: negative other than that listed in the HPI Genitourinary: negative other than that listed in the HPI Integument: negative other than that listed in the HPI Hematologic: negative other than that listed in the HPI Musculoskeletal: negative other than that listed in the HPI Neurological: negative other than that listed in the HPI Allergy/Immunologic: negative other than that listed in the HPI    Objective:   Blood pressure 124/70, pulse 86, resp. rate 18, SpO2 98 %. There is no height or weight on file to calculate BMI.   Physical Exam:  General: Alert, interactive, in no acute distress. Talkative.  Eyes: No conjunctival injection present on the right, No conjunctival injection present on the left, No conjunctival injection bilaterally, no discharge on the right, no discharge on the left and no Horner-Trantas dots present. PERRL bilaterally. EOMI without pain. No photophobia.  Ears: Right TM pearly gray with normal light reflex, Left TM pearly gray with normal light reflex, Right TM intact without perforation and Left TM intact without perforation.  Nose/Throat: External nose within normal limits and septum midline. Turbinates non-edematous without discharge. Posterior oropharynx mildly erythematous without cobblestoning in the posterior oropharynx. Tonsils 2+ without exudates.  Tongue without thrush. Lungs: Clear to auscultation without wheezing, rhonchi or rales. No increased work of breathing. CV: Normal S1/S2. No murmurs. Capillary refill <2 seconds.  Skin: Warm and dry, without lesions or rashes. Neuro:   Grossly intact. No focal deficits appreciated. Responsive to questions.  Diagnostic studies: none     Malachi Bonds, MD  [facility] [place]

## 2018-02-20 NOTE — Patient Instructions (Addendum)
1. Chronic urticaria - with essentially normal workup - With the lack of complete improvement with the antihistamines, we need to advance treatment to Xolair.  - Information provided and Tammy will reach out to you to discuss the approval process.  - Chronic hives are often times a self limited process and will "burn themselves out" over 12-18 months, although this is not always the case.  - Try avoiding pork and other read meats for a couple of weeks to see if this helps with the frequency of the hives.  - In the meantime, we will make some recommend some changes to antihistamines:   - Morning: Allegra (fexofenadine) 180-360mg  (one or two tablets)    - Evening: Xyzal (levocetirizine) 5-10mg  (one or two tablets) + Singulair (montelukast) 10mg  - You can change this dosing at home, decreasing the dose as needed or increasing the dosing as needed.   2. Return in about 3 months (around 05/23/2018).   Please inform us of any Emergency Department visits, hospitalizations, or changes in symptoms. Call us before going to the ED for breathing or allergy symptoms since we might be able to fit you in for a sick visit. Feel free to contact us anytime with any questions, problems, or concerns.  It was a pleasure to see you again today!  Websites that have reliable patient information: 1. American Academy of Asthma, Allergy, and Immunology: www.aaaai.org 2. Food Allergy Research and Education (FARE): foodallergy.org 3. Mothers of Asthmatics: http://www.asthmacommunitynetwork.org 4. American College of Allergy, Asthma, and Immunology: MissingWeapons.ca   Make sure you are registered to vote! If you have moved or changed any of your contact information, you will need to get this updated before voting!

## 2018-03-05 DIAGNOSIS — H1031 Unspecified acute conjunctivitis, right eye: Secondary | ICD-10-CM | POA: Diagnosis not present

## 2018-04-15 DIAGNOSIS — E559 Vitamin D deficiency, unspecified: Secondary | ICD-10-CM | POA: Diagnosis not present

## 2018-04-15 DIAGNOSIS — B373 Candidiasis of vulva and vagina: Secondary | ICD-10-CM | POA: Diagnosis not present

## 2018-04-15 DIAGNOSIS — D519 Vitamin B12 deficiency anemia, unspecified: Secondary | ICD-10-CM | POA: Diagnosis not present

## 2018-04-15 DIAGNOSIS — D649 Anemia, unspecified: Secondary | ICD-10-CM | POA: Diagnosis not present

## 2018-04-15 DIAGNOSIS — E782 Mixed hyperlipidemia: Secondary | ICD-10-CM | POA: Diagnosis not present

## 2018-04-15 DIAGNOSIS — N3000 Acute cystitis without hematuria: Secondary | ICD-10-CM | POA: Diagnosis not present

## 2018-04-22 DIAGNOSIS — N951 Menopausal and female climacteric states: Secondary | ICD-10-CM | POA: Diagnosis not present

## 2018-04-22 DIAGNOSIS — Z6833 Body mass index (BMI) 33.0-33.9, adult: Secondary | ICD-10-CM | POA: Diagnosis not present

## 2018-04-29 DIAGNOSIS — M2550 Pain in unspecified joint: Secondary | ICD-10-CM | POA: Diagnosis not present

## 2018-04-29 DIAGNOSIS — L509 Urticaria, unspecified: Secondary | ICD-10-CM | POA: Diagnosis not present

## 2018-04-29 DIAGNOSIS — M79641 Pain in right hand: Secondary | ICD-10-CM | POA: Diagnosis not present

## 2018-05-02 DIAGNOSIS — R0781 Pleurodynia: Secondary | ICD-10-CM | POA: Diagnosis not present

## 2018-05-02 DIAGNOSIS — R05 Cough: Secondary | ICD-10-CM | POA: Diagnosis not present

## 2018-05-02 DIAGNOSIS — Z720 Tobacco use: Secondary | ICD-10-CM | POA: Diagnosis not present

## 2018-05-02 DIAGNOSIS — J069 Acute upper respiratory infection, unspecified: Secondary | ICD-10-CM | POA: Diagnosis not present

## 2018-05-02 DIAGNOSIS — F17200 Nicotine dependence, unspecified, uncomplicated: Secondary | ICD-10-CM | POA: Diagnosis not present

## 2018-05-03 ENCOUNTER — Telehealth: Payer: Self-pay

## 2018-05-03 DIAGNOSIS — E782 Mixed hyperlipidemia: Secondary | ICD-10-CM | POA: Diagnosis not present

## 2018-05-03 DIAGNOSIS — L239 Allergic contact dermatitis, unspecified cause: Secondary | ICD-10-CM | POA: Diagnosis not present

## 2018-05-03 DIAGNOSIS — L509 Urticaria, unspecified: Secondary | ICD-10-CM | POA: Diagnosis not present

## 2018-05-03 DIAGNOSIS — T7840XA Allergy, unspecified, initial encounter: Secondary | ICD-10-CM | POA: Diagnosis not present

## 2018-05-03 NOTE — Telephone Encounter (Signed)
Patient called stating she has been to the hospital in [place] most recently this week. She states she had swelling in her arms and to the face. Patient was advise she had a cough with URI. Patient states her swelling is being caused by something else and is scheduled to see her PCP today at 11 a.m. Patient wanted Dr. Dellis Anes to be aware and if when she discuss with her PCP she will call back to schedule and appt to see. Dr. Dellis Anes.

## 2018-05-03 NOTE — Telephone Encounter (Signed)
Yeah, let's try that.... Thanks Tam Tam!   Tammy BondsJoel Sonam Huelsmann, MD [facility]

## 2018-05-03 NOTE — Telephone Encounter (Signed)
  I did reach out to her in November and she advised me that she would think about it and call if she decided to start therapy.  I can reach out to her again.

## 2018-05-03 NOTE — Telephone Encounter (Signed)
Noted.  This is likely the same process that is causing her hives and would be helped by Xolair, which we discussed at the last visit.  I will route to Tammy to get this back on her radar in case Tammy has not reached out to her yet.  Malachi Bonds, MD [facility] [place]

## 2018-05-09 ENCOUNTER — Encounter: Payer: Self-pay | Admitting: Allergy & Immunology

## 2018-05-09 ENCOUNTER — Telehealth: Payer: Self-pay | Admitting: *Deleted

## 2018-05-09 ENCOUNTER — Ambulatory Visit: Payer: BLUE CROSS/BLUE SHIELD | Admitting: Allergy & Immunology

## 2018-05-09 VITALS — BP 128/74 | HR 104 | Resp 20

## 2018-05-09 DIAGNOSIS — L501 Idiopathic urticaria: Secondary | ICD-10-CM | POA: Diagnosis not present

## 2018-05-09 DIAGNOSIS — L508 Other urticaria: Secondary | ICD-10-CM | POA: Diagnosis not present

## 2018-05-09 DIAGNOSIS — R609 Edema, unspecified: Secondary | ICD-10-CM | POA: Diagnosis not present

## 2018-05-09 MED ORDER — EPINEPHRINE 0.3 MG/0.3ML IJ SOAJ: 0.3000 mg | Freq: Once | INTRAMUSCULAR | 2 refills | Status: AC

## 2018-05-09 MED ORDER — OMALIZUMAB 150 MG ~~LOC~~ SOLR
300.0000 mg | SUBCUTANEOUS | Status: AC
  Administered 2018-05-09: 300 mg via SUBCUTANEOUS

## 2018-05-09 MED ORDER — FAMOTIDINE 20 MG PO TABS: 20.0000 mg | ORAL_TABLET | Freq: Two times a day (BID) | ORAL | 5 refills | Status: DC

## 2018-05-09 NOTE — Telephone Encounter (Signed)
Medical Records Release form has been faxed to the office of Dwana Melena, MD to request records for Alpha Gal lab results. Form has been labeled and placed in bulk scanning.

## 2018-05-09 NOTE — Patient Instructions (Addendum)
1. Chronic urticaria - We have ruled out multiple serious causes of hives and swelling in the past and this has been negative.  - You have been evaluated by Rheumatology and they agreed that you likely do not have an autoimmune cause of your hives.  - We are going to get these food labs ordered from the last visit. - I would like to get a copy of your alpha gal to have that on hand in our files.  - Increase Allegra to one tablet twice daily. - Add on famotidine 20mg  twice daily.  - Continue with Singulair 10mg  daily. - Xolair sample provided today.  - Anaphylaxis management plan provided. Audry Riles teaching provided.  - They should call you in 1-2 days to confirm your shipping address.  - Make an appointment for another Xolair injection.   2. Return in about 3 months (around 08/08/2018) for an Office Visit and one month for a Xolair Injection.   Please inform us of any Emergency Department visits, hospitalizations, or changes in symptoms. Call us before going to the ED for breathing or allergy symptoms since we might be able to fit you in for a sick visit. Feel free to contact us anytime with any questions, problems, or concerns.  It was a pleasure to see you and your family again today!  Websites that have reliable patient information: 1. American Academy of Asthma, Allergy, and Immunology: www.aaaai.org 2. Food Allergy Research and Education (FARE): foodallergy.org 3. Mothers of Asthmatics: http://www.asthmacommunitynetwork.org 4. American College of Allergy, Asthma, and Immunology: MissingWeapons.ca   Make sure you are registered to vote! If you have moved or changed any of your contact information, you will need to get this updated before voting!    Voter ID laws are going into effect for the General Election in November 2020! Be prepared! Check out [employer].dk for more details.

## 2018-05-09 NOTE — Progress Notes (Signed)
FOLLOW UP  Date of Service/Encounter:  05/09/18   Assessment:   Chronic urticaria - initiating omalizumab today   Kristee presents with continued urticaria and angioedema.  Her pictures are very impressive.  She continues to not notice any kind of trigger.  She is clearly concerned with these reactions.  She has seen a rheumatologist, and the rheumatology PA felt that these were not related to any autoimmune process.  I had already told her this with the negative ANA.  Regardless, she does not seem very reassured at all.  She does not want to treat with Xolair because she wants to "find a cause".  I did have a long discussion with her about the fact that only around 15% of patients at the most ever find a cause for the urticaria.  Therefore, she did finally make the decision to go ahead and try omalizumab.  Plan/Recommendations:   1. Chronic urticaria - We have ruled out multiple serious causes of hives and swelling in the past and this has been negative.  - You have been evaluated by Rheumatology and they agreed that you likely do not have an autoimmune cause of your hives.  - We are going to get these food labs ordered from the last visit. - I would like to get a copy of your alpha gal to have that on hand in our files.  - Increase Allegra to one tablet twice daily. - Add on famotidine 20mg  twice daily.  - Continue with Singulair 10mg  daily. - Xolair sample provided today.  - Anaphylaxis management plan provided. Audry Riles teaching provided.  - They should call you in 1-2 days to confirm your shipping address.  - Make an appointment for another Xolair injection.   2. Return in about 3 months (around 08/08/2018) for an Office Visit and one month for a Xolair Injection.    Subjective:   Zaia Vala is a 58 y.o. female presenting today for follow up of  Chief Complaint  Patient presents with  . Angioedema  . Urticaria  . Rash    Yaris Kibe has a history of the following: There  are no active problems to display for this patient.   History obtained from: chart review and patient and her husband.  Lucienne Minks Syfert's Primary Care Provider is Benita Stabile, MD.     Avamaria is a 58 y.o. female presenting for a sick visit.  She was last seen in November 2019.  She has a history of chronic idiopathic urticaria as well as perennial allergic rhinitis with sensitizations to dust mite, cockroach, and mold.  I did want to get an alpha gal panel at the last visit, but she declined since her labs are not covered well with her current insurance.  I did recommend avoiding red meats for couple of weeks to see if it divided any help to her symptoms.  She was not having much improvement with antihistamines alone, so we initiated Xolair.  However, it does not seem that she actually started Xolair.  Since the last visit, she has not done well. She started having problems two weeks ago. She started having elbow pain and itching. She woke up the next day and had arm swelling on her right arm. This worsened over the course of the weekend. She went to the ED and was given a shot (presumably steroid injection) and she had worsening symptoms. She had hives over her entire body following the injection and she has remained anxious for this  entire time. She went back to the ED on Wendesday (following her ED visit on Monday). She was diagnosed with an upper respiratory infection. She was given an "anti inflammatory shot", two pain pills, and Tussionex. She had swelling over the course of the entire week. She reports that she having tongue swelling and soreness. She had hives over her shoulders as well as her arms.   She is taking oatmeal baths with minimal improvement.  She remains on Allegra 1 tablet daily.  She has not tried increasing to twice daily as recommended at the last visit.  She is on the Singulair, but is unsure if this is provided any help. She did go to see [facility] today Azucena Fallen  PA at [facility]).  They agreed that her symptoms did not sound autoimmune.  Apparently, they did send some kind of blood work, although neither the patient nor her husband seem to know exactly what was sent.  Otherwise, there have been no changes to her past medical history, surgical history, family history, or social history.    Review of Systems: a 14-point review of systems is pertinent for what is mentioned in HPI.  Otherwise, all other systems were negative.  Constitutional: negative other than that listed in the HPI Eyes: negative other than that listed in the HPI Ears, nose, mouth, throat, and face: negative other than that listed in the HPI Respiratory: negative other than that listed in the HPI Cardiovascular: negative other than that listed in the HPI Gastrointestinal: negative other than that listed in the HPI Genitourinary: negative other than that listed in the HPI Integument: negative other than that listed in the HPI Hematologic: negative other than that listed in the HPI Musculoskeletal: negative other than that listed in the HPI Neurological: negative other than that listed in the HPI Allergy/Immunologic: negative other than that listed in the HPI    Objective:   Blood pressure 128/74, pulse (!) 104, resp. rate 20, SpO2 91 %. There is no height or weight on file to calculate BMI.   Physical Exam:  General: Alert, interactive, in moderate distress. Eyes: No conjunctival injection bilaterally, no discharge on the right, no discharge on the left and no Horner-Trantas dots present. PERRL bilaterally. EOMI without pain. No photophobia.  Ears: Right TM pearly gray with normal light reflex, Left TM pearly gray with normal light reflex, Right TM intact without perforation and Left TM intact without perforation.  Nose/Throat: External nose within normal limits and septum midline. Turbinates edematous and pale with clear discharge. Posterior oropharynx  erythematous without cobblestoning in the posterior oropharynx. Tonsils 2+ without exudates.  Tongue without thrush. Lungs: Clear to auscultation without wheezing, rhonchi or rales. No increased work of breathing. CV: Normal S1/S2. No murmurs. Capillary refill <2 seconds.  Skin: Scattered erythematous urticarial type lesions primarily located bilateral arms without marked angioedema appreciated today , nonvesicular. Neuro:   Grossly intact. No focal deficits appreciated. Responsive to questions.  Diagnostic studies: none  Xolair given in clinic today. Patient was watched for 60 minutes following the injection. She experienced no adverse reactions whatsoever.        Malachi Bonds, MD  [facility] [place]

## 2018-05-09 NOTE — Progress Notes (Signed)
Immunotherapy   Patient Details  Name: Tammy Flores MRN: [id number] Date of Birth: [birth year]  05/09/2018  Tammy Flores started injections for  Tammy Flores 300mg   Frequency: Every 4 Weeks  Epi-Pen: Yes  Consent signed and patient instructions given. Patient received 300mg  Sample Injection in the office today. Patient waited 30 minutes after injection and did not experience any issues.    Michel Harrow R 05/09/2018, 11:57 AM

## 2018-05-14 LAB — ALLERGEN PROFILE, BASIC FOOD
Allergen Corn, IgE: <0.1 kU/L
Beef IgE: <0.1 kU/L
Chocolate/Cacao IgE: <0.1 kU/L
Egg, Whole IgE: <0.1 kU/L
Food Mix (Seafoods) IgE: POSITIVE — AB
Milk IgE: <0.1 kU/L
Peanut IgE: <0.1 kU/L
Pork IgE: <0.1 kU/L
Soybean IgE: <0.1 kU/L
Wheat IgE: <0.1 kU/L

## 2018-06-05 ENCOUNTER — Telehealth: Payer: Self-pay

## 2018-06-05 NOTE — Telephone Encounter (Signed)
Patient is calling to see if she can stop her xolair.  Please Advise

## 2018-06-05 NOTE — Telephone Encounter (Signed)
Dr. Gallagher please advise.  

## 2018-06-06 NOTE — Telephone Encounter (Signed)
Pt called and wanted you to call her and let her know something about the inj. She cancelled it for tomorrow unless you want her to get it, she said that she is not having any problems since they have found out what she has a allergic too.

## 2018-06-06 NOTE — Telephone Encounter (Signed)
I did call Ms. Blackham back to discuss her issues. She stopped taking fish oil the week before she received the Xolair. She received the call that the seafood was positive and she has stopped all of her seafood.   She is also interested in stopping her Singulair. She remains on the Allegra as well as Benadryl as needed. She feels that she has a good handle on her symptoms.   We are going to stop the Singulair and the Xolair. She will give [place] a call if there are any other problems.  Malachi Bonds, MD [facility] [place]

## 2018-06-07 ENCOUNTER — Ambulatory Visit: Payer: Self-pay

## 2018-06-07 NOTE — Telephone Encounter (Signed)
I will move her to inactive. If she develops hives again we can restart later

## 2018-08-01 DIAGNOSIS — K219 Gastro-esophageal reflux disease without esophagitis: Secondary | ICD-10-CM | POA: Diagnosis not present

## 2018-08-01 DIAGNOSIS — J4521 Mild intermittent asthma with (acute) exacerbation: Secondary | ICD-10-CM | POA: Diagnosis not present

## 2018-08-01 DIAGNOSIS — J309 Allergic rhinitis, unspecified: Secondary | ICD-10-CM | POA: Diagnosis not present

## 2018-08-19 DIAGNOSIS — M545 Low back pain: Secondary | ICD-10-CM | POA: Diagnosis not present

## 2018-08-26 DIAGNOSIS — N390 Urinary tract infection, site not specified: Secondary | ICD-10-CM | POA: Diagnosis not present

## 2018-08-28 DIAGNOSIS — M546 Pain in thoracic spine: Secondary | ICD-10-CM | POA: Diagnosis not present

## 2018-08-28 DIAGNOSIS — Z8719 Personal history of other diseases of the digestive system: Secondary | ICD-10-CM | POA: Diagnosis not present

## 2018-08-28 DIAGNOSIS — R109 Unspecified abdominal pain: Secondary | ICD-10-CM | POA: Diagnosis not present

## 2018-08-28 DIAGNOSIS — Z6833 Body mass index (BMI) 33.0-33.9, adult: Secondary | ICD-10-CM | POA: Diagnosis not present

## 2018-10-23 DIAGNOSIS — L03114 Cellulitis of left upper limb: Secondary | ICD-10-CM | POA: Diagnosis not present

## 2018-10-23 DIAGNOSIS — D649 Anemia, unspecified: Secondary | ICD-10-CM | POA: Diagnosis not present

## 2018-10-23 DIAGNOSIS — R7301 Impaired fasting glucose: Secondary | ICD-10-CM | POA: Diagnosis not present

## 2018-10-23 DIAGNOSIS — T7840XA Allergy, unspecified, initial encounter: Secondary | ICD-10-CM | POA: Diagnosis not present

## 2018-10-23 DIAGNOSIS — J4521 Mild intermittent asthma with (acute) exacerbation: Secondary | ICD-10-CM | POA: Diagnosis not present

## 2018-10-23 DIAGNOSIS — M79643 Pain in unspecified hand: Secondary | ICD-10-CM | POA: Diagnosis not present

## 2018-10-23 DIAGNOSIS — M79673 Pain in unspecified foot: Secondary | ICD-10-CM | POA: Diagnosis not present

## 2019-02-25 ENCOUNTER — Other Ambulatory Visit: Payer: Self-pay

## 2019-02-25 DIAGNOSIS — Z20822 Contact with and (suspected) exposure to covid-19: Secondary | ICD-10-CM

## 2019-02-27 ENCOUNTER — Telehealth: Payer: Self-pay | Admitting: *Deleted

## 2019-02-27 NOTE — Telephone Encounter (Signed)
Pt called to get her covid test results. Pt advised that her result is not back yet. She voiced understanding.

## 2019-02-28 LAB — NOVEL CORONAVIRUS, NAA: SARS-CoV-2, NAA: NOT DETECTED

## 2019-03-28 ENCOUNTER — Other Ambulatory Visit: Payer: Self-pay

## 2019-03-28 DIAGNOSIS — Z20822 Contact with and (suspected) exposure to covid-19: Secondary | ICD-10-CM

## 2019-03-29 LAB — NOVEL CORONAVIRUS, NAA: SARS-CoV-2, NAA: NOT DETECTED

## 2019-04-28 DIAGNOSIS — L03114 Cellulitis of left upper limb: Secondary | ICD-10-CM | POA: Diagnosis not present

## 2019-04-28 DIAGNOSIS — Z20828 Contact with and (suspected) exposure to other viral communicable diseases: Secondary | ICD-10-CM | POA: Diagnosis not present

## 2019-04-28 DIAGNOSIS — J4521 Mild intermittent asthma with (acute) exacerbation: Secondary | ICD-10-CM | POA: Diagnosis not present

## 2019-04-28 DIAGNOSIS — J309 Allergic rhinitis, unspecified: Secondary | ICD-10-CM | POA: Diagnosis not present

## 2019-04-28 DIAGNOSIS — T7840XA Allergy, unspecified, initial encounter: Secondary | ICD-10-CM | POA: Diagnosis not present

## 2019-06-09 DIAGNOSIS — R1030 Lower abdominal pain, unspecified: Secondary | ICD-10-CM | POA: Diagnosis not present

## 2019-06-11 ENCOUNTER — Encounter: Payer: Self-pay | Admitting: Internal Medicine

## 2019-06-27 ENCOUNTER — Ambulatory Visit: Payer: BLUE CROSS/BLUE SHIELD | Admitting: Gastroenterology

## 2019-07-10 ENCOUNTER — Encounter: Payer: Self-pay | Admitting: Gastroenterology

## 2019-07-10 NOTE — Progress Notes (Signed)
Referring Provider: Celene Squibb, MD Primary Care Physician:  Celene Squibb, MD Primary Gastroenterologist:  Dr. Gala Romney  Chief Complaint  Patient presents with  . Diarrhea    several days/week  . Abdominal Cramping    HPI:   Tammy Flores is a 59 y.o. female presenting today at the request of Celene Squibb, MD for IBS and abdominal cramps.  Patient was seen at [facility] urgent care on 08/28/2018 reporting RLQ abdominal pain that been present for 5 months.  Achy/colicky in nature, 4/12 in intensity. Urinalysis and abdominal x-ray completed. Urinalysis negative. Unable to view x-ray results. She was treated with Bactrim and Flagyl and advised to follow-up with PCP.   Reviewed PCP note dated 06/09/2019.  Reported severe cramping in her lower abdomen "like having a baby".  His urgently needs to go to the bathroom right away.  Has loose stools.  No association with eating.  Plans to try Bentyl and refer to GI.  Today: Diarrhea is off and on. Present for years. Had TCS at [facility] in [place], [place] 6-7 years ago.  States she had diverticulitis and infection outside of colon.  No polyps. Has been treated with antibiotics intermittently several times for diverticulitis but no CTs to confirm. Has been diagnosed with IBS. When she ends up with diarrhea, she will stop eating because she doesn't know what is causing it.    2 years ago started having hives. Has many allergies. Had issues with ciprofloxacin in the past-itching and hair crawling.   Diarrhea seems to improve after being treated with antibiotics.  Most recent episode of diarrhea started 3 weeks ago. Prior to this, was having 1 soft formed BM daily. At least 4 days out of the week she will have about 5 watery stools a day. Also with nocturnal stools. Associated abdominal cramping prior to diarrhea that resolves thereafter. Also with urgency, increased gas and bloating when diarrhea starts. No blood in the stool. No melena. No antibiotics since May  2020.No hospitalization. No contact livestock. No well water. No unintentional weight loss. Dad has similar symptoms. States he is anemic and had had to take iron. No known Crohn's or UC in the family. No family history of colon cancer.   Food triggers: chocolate, beans, cabbage. No worsening with bread products.   Eats cottage cheese. Almond milk. No recent ice cream. No yogurt.   Diarrhea worsens with stress.   Not taking anything for diarrhea. Has tried pepto and kaopectate. Didn't try the Bentyl PCP prescribed.   No nausea vomiting. No GERD symptoms. No dysphagia.   Dietary intolerances, SIBO, IBS-D  Past Medical History:  Diagnosis Date  . Chronic urticaria    found to be secondary to seafood allergy  . Diverticulitis   . GERD (gastroesophageal reflux disease)   . IBS (irritable bowel syndrome)   . Pleurisy   . Seizures ([place])    Not on medications.    Past Surgical History:  Procedure Laterality Date  . COLONOSCOPY    . PARTIAL HYSTERECTOMY     In her 3s  . TIBIA FRACTURE SURGERY Right    at 59 y.o.    Current Outpatient Medications  Medication Sig Dispense Refill  . fexofenadine (ALLEGRA) 180 MG tablet Take 2 tablets (360 mg total) by mouth daily. 60 tablet 5  . levocetirizine (XYZAL) 5 MG tablet 1-2 tablets every morning and 1-2 tablets every evening. 120 tablet 5  . montelukast (SINGULAIR) 10 MG tablet Take 1 tablet (10  mg total) by mouth at bedtime. 30 tablet 5  . omeprazole (PRILOSEC) 20 MG capsule Take 20 mg by mouth daily.   2   Current Facility-Administered Medications  Medication Dose Route Frequency Provider Last Rate Last Admin  . omalizumab Geoffry Paradise) injection 300 mg  300 mg Subcutaneous Q28 days Alfonse Spruce, MD   300 mg at 05/09/18 1153    Allergies as of 07/11/2019 - Review Complete 07/11/2019  Allergen Reaction Noted  . Iodine Anaphylaxis 09/22/2010  . Food Other (See Comments) 02/23/2011  . Shellfish allergy  05/09/2016    Family  History  Problem Relation Age of Onset  . COPD Father   . Asthma Sister   . Colon cancer Neg Hx   . Inflammatory bowel disease Neg Hx     Social History   Socioeconomic History  . Marital status: Married    Spouse name: Not on file  . Number of children: Not on file  . Years of education: Not on file  . Highest education level: Not on file  Occupational History  . Not on file  Tobacco Use  . Smoking status: Current Every Day Smoker    Packs/day: 0.50    Years: 42.00    Pack years: 21.00    Types: Cigarettes  . Smokeless tobacco: Never Used  Substance and Sexual Activity  . Alcohol use: No  . Drug use: No  . Sexual activity: Yes  Other Topics Concern  . Not on file  Social History Narrative  . Not on file   Social Determinants of Health   Financial Resource Strain:   . Difficulty of Paying Living Expenses:   Food Insecurity:   . Worried About Programme researcher, broadcasting/film/video in the Last Year:   . Barista in the Last Year:   Transportation Needs:   . Freight forwarder (Medical):   Marland Kitchen Lack of Transportation (Non-Medical):   Physical Activity:   . Days of Exercise per Week:   . Minutes of Exercise per Session:   Stress:   . Feeling of Stress :   Social Connections:   . Frequency of Communication with Friends and Family:   . Frequency of Social Gatherings with Friends and Family:   . Attends Religious Services:   . Active Member of Clubs or Organizations:   . Attends Banker Meetings:   Marland Kitchen Marital Status:   Intimate Partner Violence:   . Fear of Current or Ex-Partner:   . Emotionally Abused:   Marland Kitchen Physically Abused:   . Sexually Abused:     Review of Systems: Gen: Denies any fever, cold or flulike symptoms, lightheadedness, dizziness, presyncope, syncope. CV: Denies chest pain or heart palpitations.  Resp: Denies shortness of breath or cough. GI: See HPI GU : Denies urinary burning, urinary frequency, urinary hesitancy Derm: Denies rash Psych:  Denies depression or anxiety.  Heme: See HPI  Physical Exam: BP 136/74   Pulse 80   Temp (!) 96.6 F (35.9 C) (Temporal)   Ht 5\' 3"  (1.6 m)   Wt 191 lb 3.2 oz (86.7 kg)   BMI 33.87 kg/m  General:   Alert and oriented. Pleasant and cooperative. Well-nourished and well-developed.  Head:  Normocephalic and atraumatic. Eyes:  Without icterus, sclera clear and conjunctiva pink.  Ears:  Normal auditory acuity. Lungs:  Clear to auscultation bilaterally. No wheezes, rales, or rhonchi. No distress.  Heart:  S1, S2 present without murmurs appreciated.  Abdomen:  +BS, soft, non-tender  and non-distended. No HSM noted. No guarding or rebound. No masses appreciated.  Rectal:  Deferred  Msk:  Symmetrical without gross deformities. Normal posture. Extremities:  Without trace bilateral LE edema. Neurologic:  Alert and  oriented x4;  grossly normal neurologically. Skin:  Intact without significant lesions or rashes. Psych:  Normal mood and affect.

## 2019-07-11 ENCOUNTER — Other Ambulatory Visit: Payer: Self-pay

## 2019-07-11 ENCOUNTER — Ambulatory Visit (INDEPENDENT_AMBULATORY_CARE_PROVIDER_SITE_OTHER): Payer: BC Managed Care – PPO | Admitting: Gastroenterology

## 2019-07-11 ENCOUNTER — Encounter: Payer: Self-pay | Admitting: Gastroenterology

## 2019-07-11 DIAGNOSIS — R197 Diarrhea, unspecified: Secondary | ICD-10-CM | POA: Insufficient documentation

## 2019-07-11 DIAGNOSIS — Z79899 Other long term (current) drug therapy: Secondary | ICD-10-CM | POA: Diagnosis not present

## 2019-07-11 NOTE — Assessment & Plan Note (Addendum)
59 year old female reports several year history of intermittent episodes of diarrhea with associated abdominal cramping, urgency, and increased gas and bloating.  Currently having about 5 watery BMs a day and nocturnal stools at least 4 days out of the week for the last 3 weeks. Reports being previously diagnosed with IBS but has never been treated for this.  Also reports colonoscopy 6-7 years ago with diverticulitis with infection "outside of her colon" in [place].  She was treated with antibiotics at that time and since then, has been treated with antibiotics intermittently with diarrhea flares as presumed diverticulitis.  No CTs to confirm.  Patient reports having many allergies and was on immune therapy for chronic urticaria until finding out she was allergic to seafood.  She does note chocolate, beans, and cabbage will worsen diarrhea.  Also consumes cottage cheese regularly.  Denies bright red blood per rectum, melena, unintentional weight loss.  Reports dad has similar symptoms and is also anemic requiring iron but no known Crohn's or ulcerative colitis in the family.   Symptoms sound classically like IBS-D.  She may also have food intolerances considering her significant allergy history.  SIBO is also on the differential as patient's symptoms seem to improve after antibiotic treatment. No recent labs on file to evaluate for any underlying anemia but I do not suspect IBD.  As she is having nocturnal stools, I will go ahead and rule out infectious etiologies.   Labs: CBC, BMP, TSH, IgA total, TTG IgA, C. difficile GDH and toxin A/B, GI pathogen panel. Advise she follow a lactose-free diet for now or take Lactaid tablets prior to consuming any dairy products. If labs are unrevealing, will plan to trial Bentyl.  Requesting colonoscopy records. Follow-up in 4 months.  Addendum: Celiac screen negative. TSH within normal limits. Evidence of IDA with hemoglobin 11.1 , Ferritin 6, iron 27, saturation 8%.  Stool studies weren't completed due to resolution of diarrhea. Considering IDA, IBD is now higher on the differential. Plans for TCS + EGD with [facility] in the near future for IDA which will also help evaluate her lower GI symptoms. The risks, benefits, and alternatives have been discussed in detail with patient. They have stated understanding and desire to proceed.

## 2019-07-11 NOTE — Patient Instructions (Signed)
Please have labs and stool studies completed.  I will call you with your results and further recommendations.  For now, I recommend you follow a lactose-free diet or take Lactaid pills prior to consuming any dairy products.  I am also requesting your colonoscopy records.  We will plan to follow-up with you in 4 months.  Call with questions or concerns prior.  Ermalinda Memos, PA-C [facility]

## 2019-07-13 NOTE — Progress Notes (Signed)
Kidney function and electrolytes within normal limits.  Thyroid function within normal limits.  Her hemoglobin is low at 11.1.  We are waiting for celiac serologies and stool studies to result.  In the meantime, considering anemia and diarrhea, I would like for her to complete an Ifobt. Additionally, I would like for an iron panel to be completed. Not sure if this can be added on to current labs or if patient needs to have blood work completed again.

## 2019-07-14 LAB — BASIC METABOLIC PANEL
BUN: 7 mg/dL (ref 7–25)
CO2: 26 mmol/L (ref 20–32)
Calcium: 9.5 mg/dL (ref 8.6–10.4)
Chloride: 108 mmol/L (ref 98–110)
Creat: 0.66 mg/dL (ref 0.50–1.05)
Glucose, Bld: 97 mg/dL (ref 65–139)
Potassium: 4.4 mmol/L (ref 3.5–5.3)
Sodium: 141 mmol/L (ref 135–146)

## 2019-07-14 LAB — TSH: TSH: 3 mIU/L (ref 0.40–4.50)

## 2019-07-14 LAB — IRON,TIBC AND FERRITIN PANEL
%SAT: 8 % (calc) — ABNORMAL LOW (ref 16–45)
Ferritin: 6 ng/mL — ABNORMAL LOW (ref 16–232)
Iron: 27 ug/dL — ABNORMAL LOW (ref 45–160)
TIBC: 338 mcg/dL (calc) (ref 250–450)

## 2019-07-14 LAB — TEST AUTHORIZATION

## 2019-07-14 LAB — CBC WITH DIFFERENTIAL/PLATELET
Absolute Monocytes: 675 cells/uL (ref 200–950)
Basophils Absolute: 99 cells/uL (ref 0–200)
Basophils Relative: 1.4 %
Eosinophils Absolute: 320 cells/uL (ref 15–500)
Eosinophils Relative: 4.5 %
HCT: 34.6 % — ABNORMAL LOW (ref 35.0–45.0)
Hemoglobin: 11.1 g/dL — ABNORMAL LOW (ref 11.7–15.5)
Lymphs Abs: 2435 cells/uL (ref 850–3900)
MCH: 26.9 pg — ABNORMAL LOW (ref 27.0–33.0)
MCHC: 32.1 g/dL (ref 32.0–36.0)
MCV: 84 fL (ref 80.0–100.0)
MPV: 11.4 fL (ref 7.5–12.5)
Monocytes Relative: 9.5 %
Neutro Abs: 3571 cells/uL (ref 1500–7800)
Neutrophils Relative %: 50.3 %
Platelets: 358 10*3/uL (ref 140–400)
RBC: 4.12 10*6/uL (ref 3.80–5.10)
RDW: 16.3 % — ABNORMAL HIGH (ref 11.0–15.0)
Total Lymphocyte: 34.3 %
WBC: 7.1 10*3/uL (ref 3.8–10.8)

## 2019-07-14 LAB — TISSUE TRANSGLUTAMINASE, IGA: (tTG) Ab, IgA: 1 U/mL

## 2019-07-14 LAB — IGA: Immunoglobulin A: 136 mg/dL (ref 47–310)

## 2019-07-14 NOTE — Progress Notes (Signed)
Patient has evidence of iron deficiency anemia.  Ferritin (iron stores) is low at 6, total iron low at 27, percent saturation low at 8%.  Considering chronic intermittent diarrhea, associated abdominal pain, and now with new diagnosis of IDA, IBD (Crohn's disease or ulcerative colitis) are higher on the differential.    Patient needs to start OTC iron (containing 325 mg ferrous sulfate) twice daily.  We will need to recheck iron panel in 8 weeks.  Please arrange. Ultimately, patient is going to need procedures (TCS/EGD) to evaluate new onset IDA.  Prior to pursuing procedures, I need her to complete the stool studies that were ordered at the time of her office visit.  It does not look like these were submitted.

## 2019-07-15 ENCOUNTER — Other Ambulatory Visit: Payer: Self-pay

## 2019-07-15 DIAGNOSIS — D509 Iron deficiency anemia, unspecified: Secondary | ICD-10-CM

## 2019-07-21 ENCOUNTER — Telehealth: Payer: Self-pay | Admitting: Internal Medicine

## 2019-07-21 ENCOUNTER — Encounter: Payer: Self-pay | Admitting: *Deleted

## 2019-07-21 MED ORDER — CLENPIQ 10-3.5-12 MG-GM -GM/160ML PO SOLN: 1.0000 | Freq: Once | ORAL | 0 refills | Status: AC

## 2019-07-21 NOTE — Telephone Encounter (Signed)
Spoke with patient. See lab result note. Plans for TCS + EGD for evaluation of IDA.

## 2019-07-21 NOTE — Telephone Encounter (Signed)
Pt was returning a call from [facility]. She is aware that [facility] will call her back. [phone]

## 2019-08-17 ENCOUNTER — Telehealth: Payer: Self-pay | Admitting: Gastroenterology

## 2019-08-17 NOTE — Telephone Encounter (Signed)
Received and reviewed colonoscopy report from [facility] health dated 05/14/2009.  Surgeon: Dr. Reece Levy Indication: Diffuse moderately severe abdominal pain, recent bleeding, elevated CRP. Findings: Terminal ileum appeared normal.  Multiple biopsies taken.  Ascending, transverse, descending, sigmoid, and cecum appeared normal.  Multiple biopsies taken.  Small internal hemorrhoids in the rectum.  No polyps. Recommended repeat colonoscopy in 10 years.  Surgical pathology: Duodenal biopsy: No significant pathologic change. Stomach biopsies: Mild mucosal congestion, no H. pylori. Esophageal biopsy: Fragments of glandular mucosa and small fragments of squamous epithelium exhibiting mild chronic inflammation and congestion. Terminal ileum: No significant pathologic change Random colon biopsies: Mucosal congestion and focal hyperplastic polyp.  It appears patient also had EGD at time of TCS. We will request EGD report for our records. No additional recommendations at this time. Patient is scheduled for EGD/TCS on 08/29/19.   Darl Pikes, can you request EGD report?

## 2019-08-20 ENCOUNTER — Telehealth: Payer: Self-pay | Admitting: Internal Medicine

## 2019-08-20 NOTE — Telephone Encounter (Signed)
Noted  

## 2019-08-20 NOTE — Telephone Encounter (Signed)
Called pt. Confirmed she wants to cancel procedure and did not want to r/s. Called endo and LMOVM to cancel. FYI to [facility]

## 2019-08-20 NOTE — Telephone Encounter (Signed)
Patient called to cancel procedure

## 2019-08-27 ENCOUNTER — Other Ambulatory Visit (HOSPITAL_COMMUNITY): Payer: BC Managed Care – PPO

## 2019-08-29 ENCOUNTER — Encounter (HOSPITAL_COMMUNITY): Payer: Self-pay

## 2019-08-29 ENCOUNTER — Ambulatory Visit (HOSPITAL_COMMUNITY): Admit: 2019-08-29 | Payer: BC Managed Care – PPO | Admitting: Internal Medicine

## 2019-08-29 SURGERY — COLONOSCOPY
Anesthesia: Moderate Sedation

## 2019-09-01 NOTE — Telephone Encounter (Signed)
Faxed release of records to [facility]

## 2019-09-05 ENCOUNTER — Ambulatory Visit: Payer: BLUE CROSS/BLUE SHIELD | Admitting: Allergy & Immunology

## 2019-09-15 ENCOUNTER — Telehealth: Payer: Self-pay | Admitting: Gastroenterology

## 2019-09-15 ENCOUNTER — Encounter: Payer: Self-pay | Admitting: Gastroenterology

## 2019-09-15 NOTE — Telephone Encounter (Signed)
Received and reviewed EGD report dated 05/14/2009 from [facility] health with Dr. Reece Levy  Impression: Normal upper third of the esophagus and mid third of the esophagus.  Patient has GERD.  GE junction visualized.  Multiple biopsies taken.  Normal cardia, fundus, and body of the stomach.  Normal antrum.  Normal duodenal bulb and second portion of duodenum.  Normal third portion of the duodenum.  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  Bipolar diathermy was applied to control bleeding.  Pathology:  Duodenal biopsy: No significant pathologic change. Stomach biopsies: Mild mucosal congestion, no H. pylori. Esophageal biopsy: Fragments of glandular mucosa and small fragments of squamous epithelium exhibiting mild chronic inflammation and congestion.  Will have report scanned into patients chart.

## 2019-11-12 ENCOUNTER — Ambulatory Visit: Payer: BC Managed Care – PPO | Admitting: Gastroenterology

## 2019-12-16 IMAGING — US US AORTA
1 series · 14 of 23 positions shown · non-contrast
Comparison: CT scan May 09, 2016

CLINICAL DATA: Smoker.  Dyslipidemia.

EXAM:
ULTRASOUND OF ABDOMINAL AORTA
TECHNIQUE: Ultrasound examination of the abdominal aorta was performed to
evaluate for abdominal aortic aneurysm.

[Series 1: us aorta · 0.22mm/px · 14 of 23 slices shown]
[im 1/23]
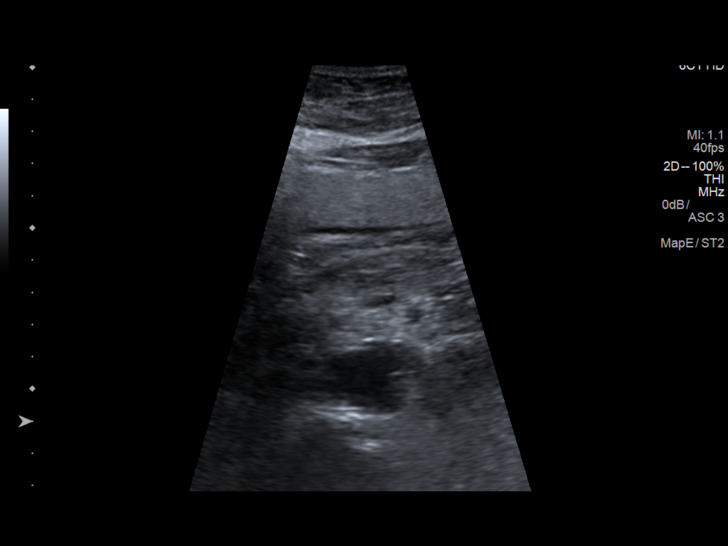
[im 3/23]
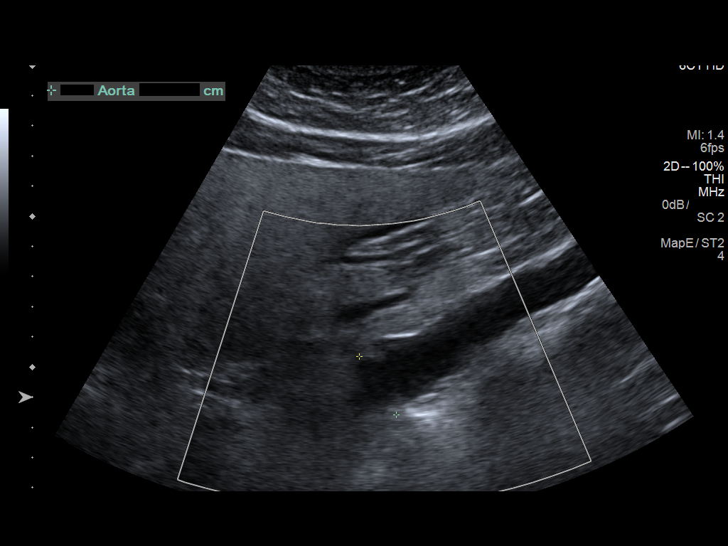
[im 5/23]
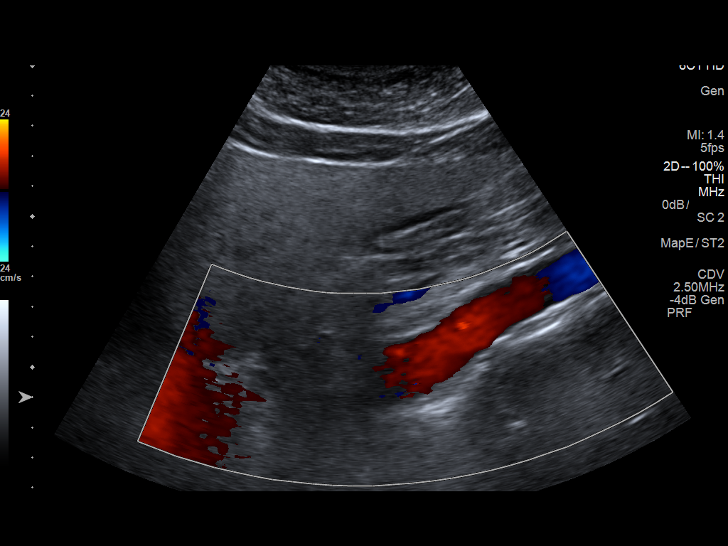
[im 6/23]
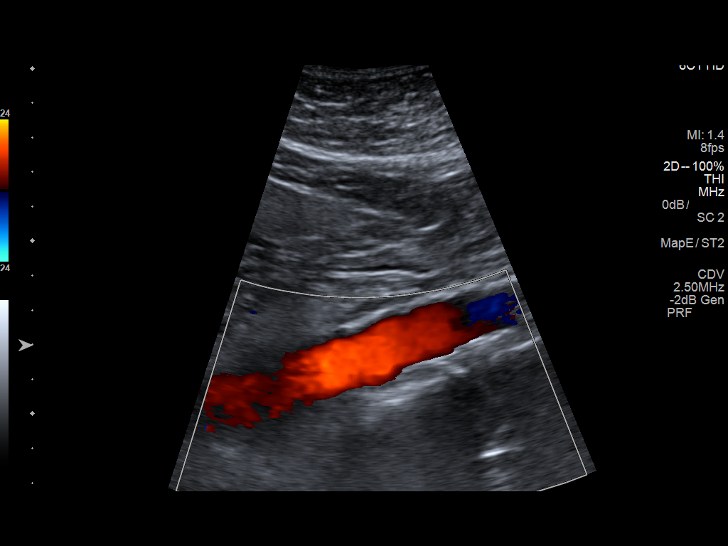
[im 8/23]
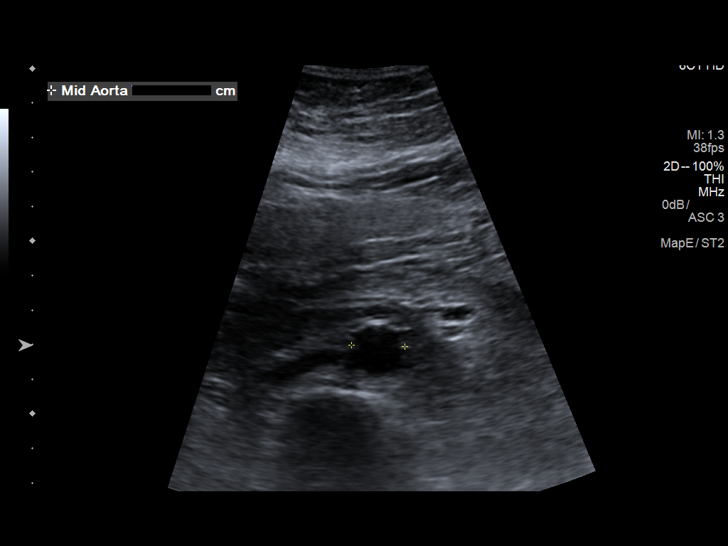
[im 10/23]
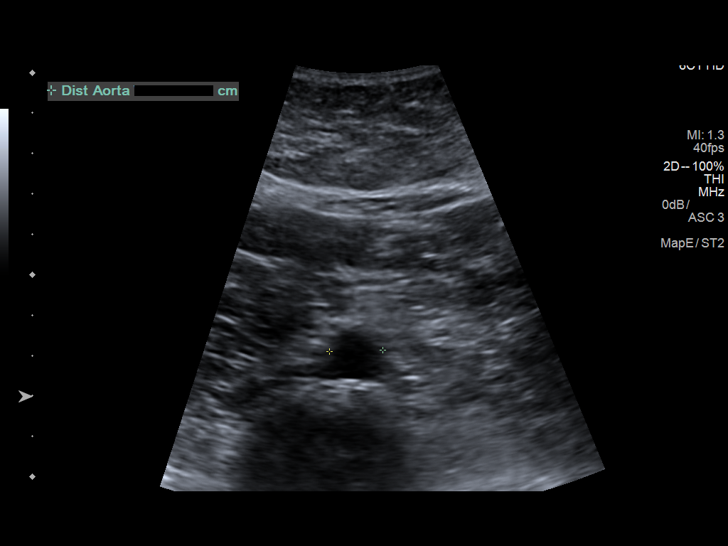
[im 11/23]
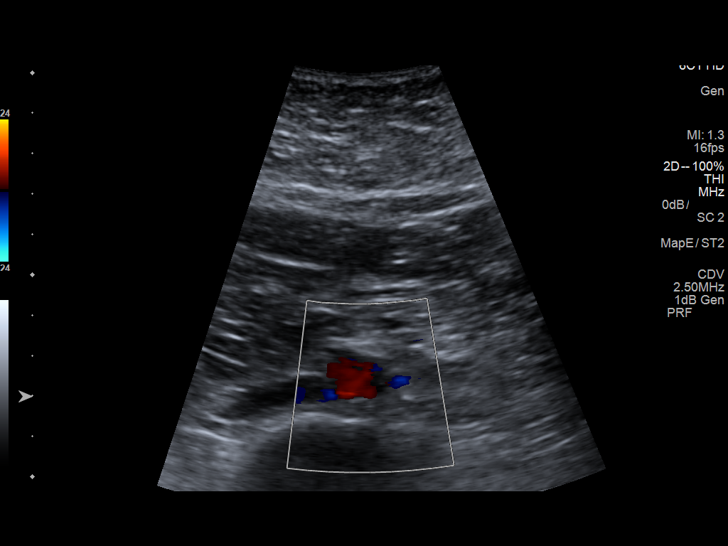
[im 13/23]
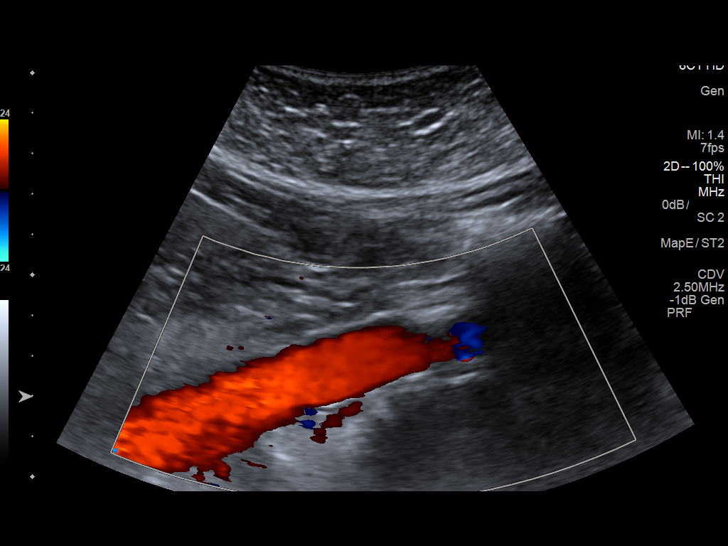
[im 14/23]
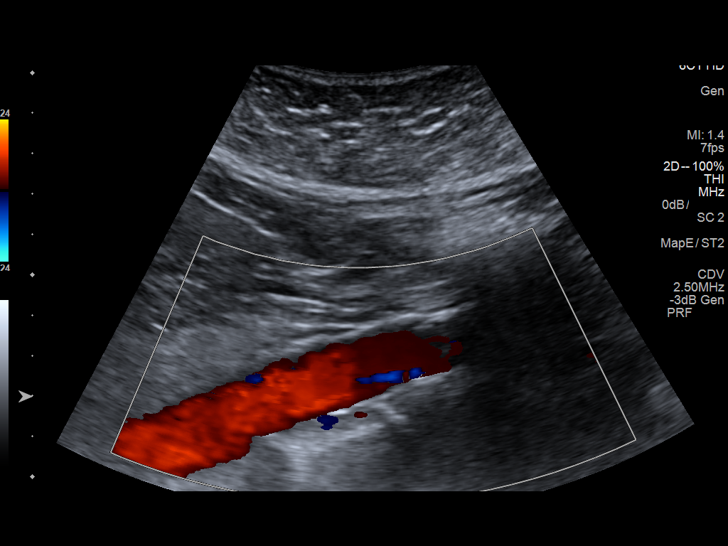
[im 16/23]
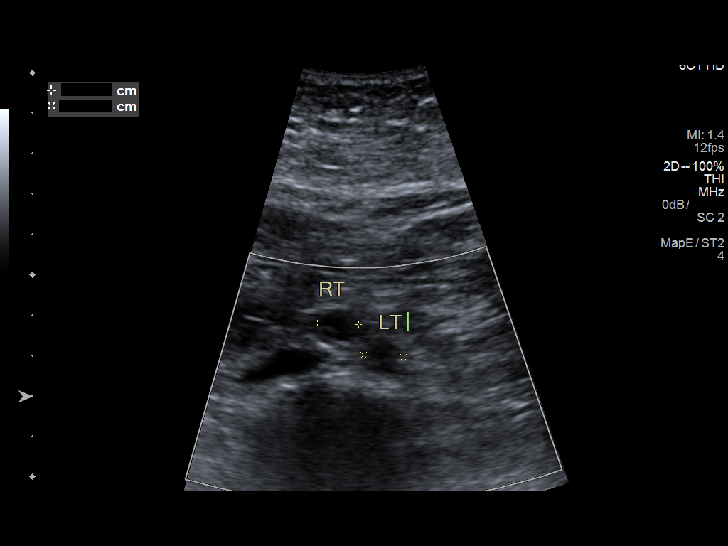
[im 18/23]
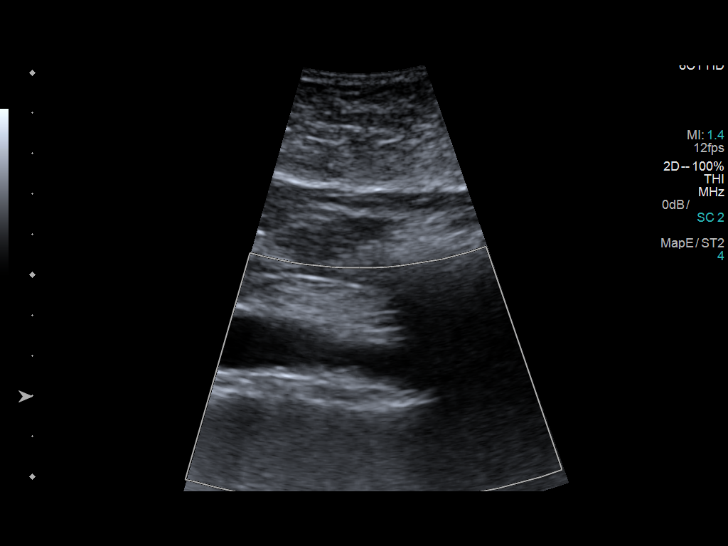
[im 19/23]
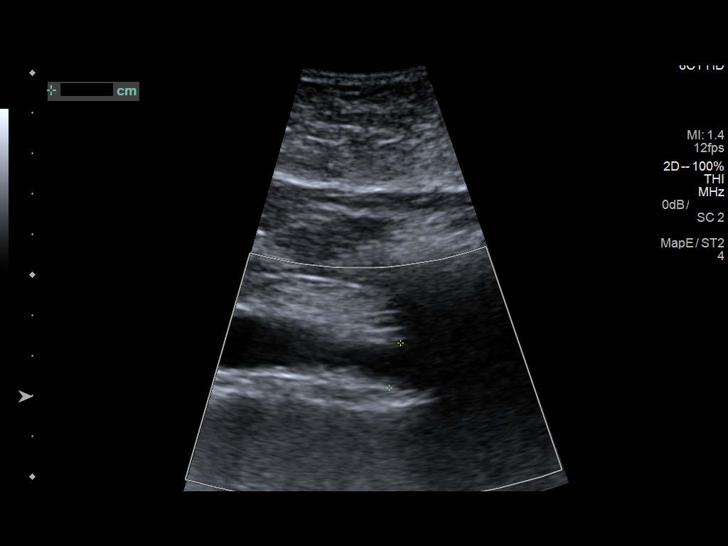
[im 21/23]
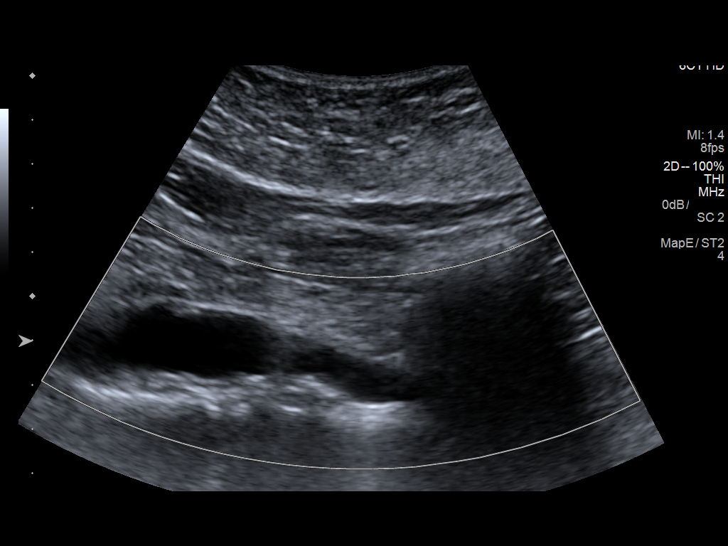
[im 23/23]
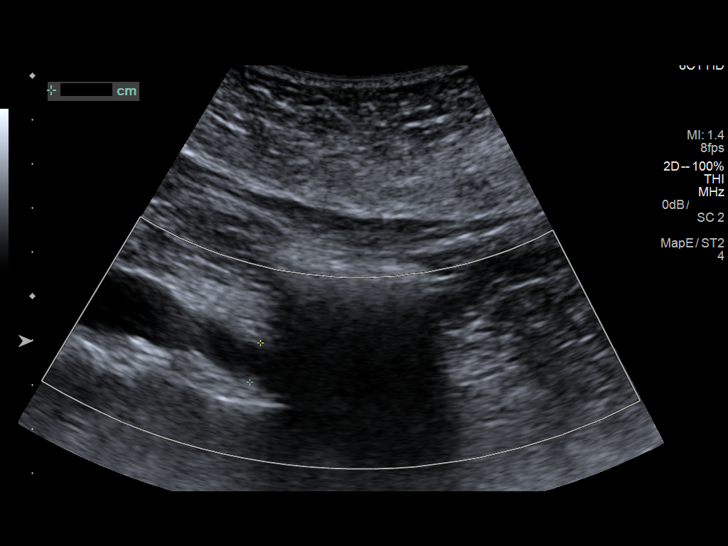

[14 of 23 positions shown; findings below may reference images not displayed]

FINDINGS: Abdominal aortic measurements as follows:

Proximal:  2.3 x 2.0 cm

Mid:  2.1 x 1.5 cm

Distal:  2.0 x 1.3 cm

Right common iliac artery: 1.1 x 1.0 cm

Left common iliac artery: 0.9 x 1.0 cm
IMPRESSION: No aortic aneurysm identified.

## 2019-12-16 IMAGING — US US CAROTID DUPLEX BILAT
1 series · 13 of 24 positions shown · non-contrast
Comparison: None.

CLINICAL DATA: 57-year-old female with a history of smoking.

Cardiovascular risk factors include hyperlipidemia, tobacco use
EXAM:
BILATERAL CAROTID DUPLEX ULTRASOUND
TECHNIQUE: Gray scale imaging, color Doppler and duplex ultrasound were
performed of bilateral carotid and vertebral arteries in the neck.

[Series 1: us carotid duplex bilat · 0.05mm/px · 13 of 70 slices shown]
[im 1/70]
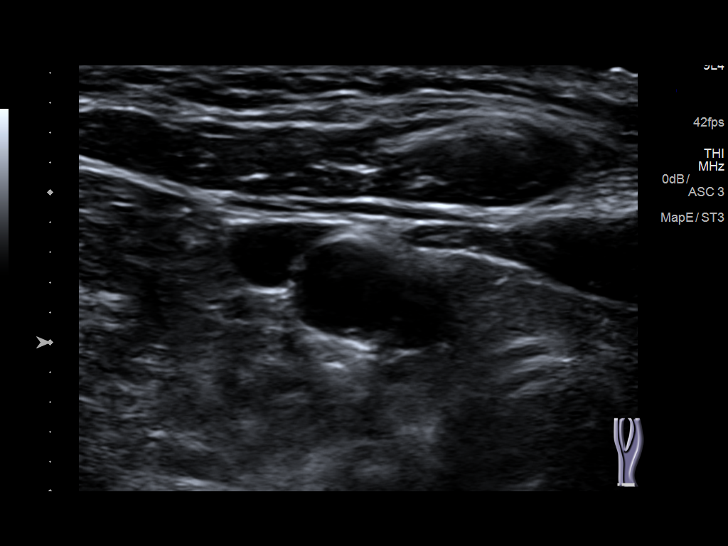
[im 7/70]
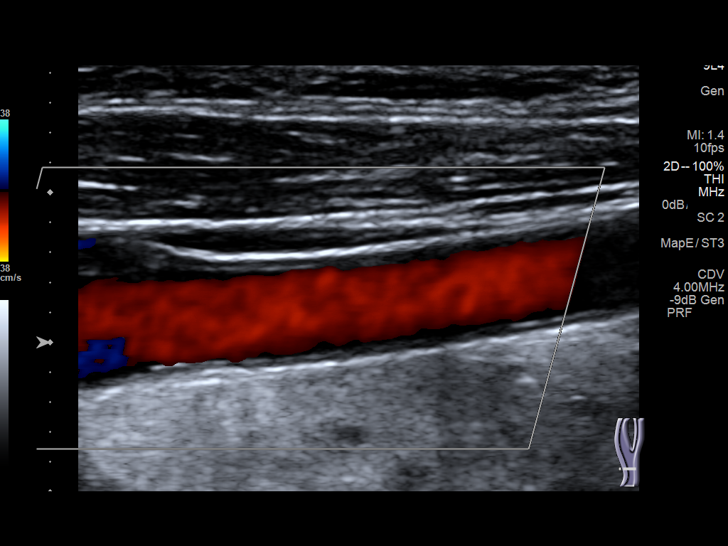
[im 13/70]
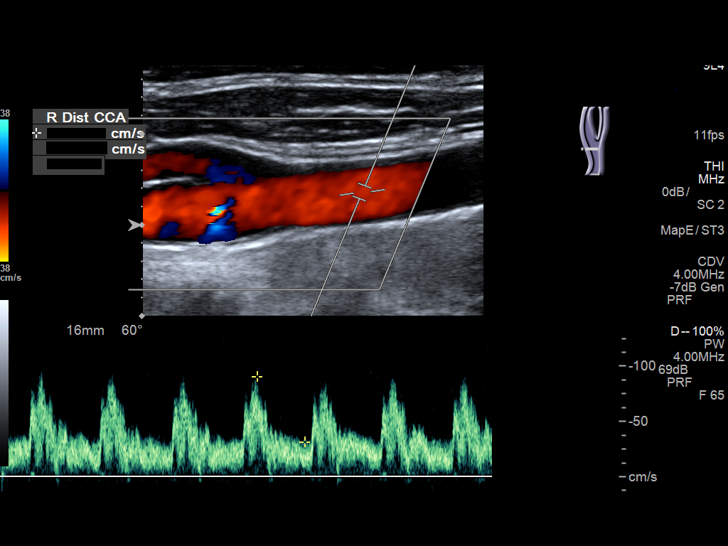
[im 19/70]
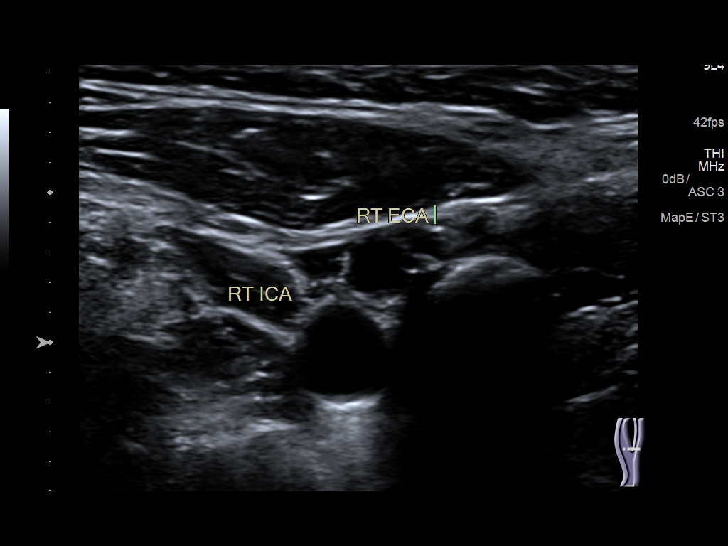
[im 25/70]
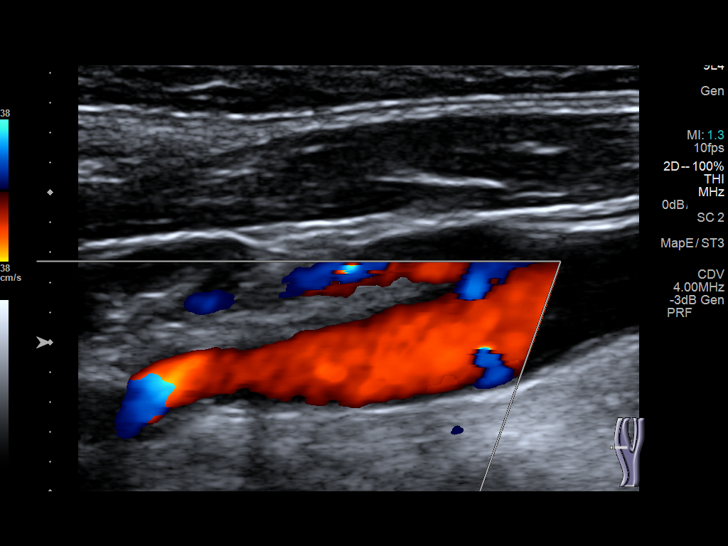
[im 31/70]
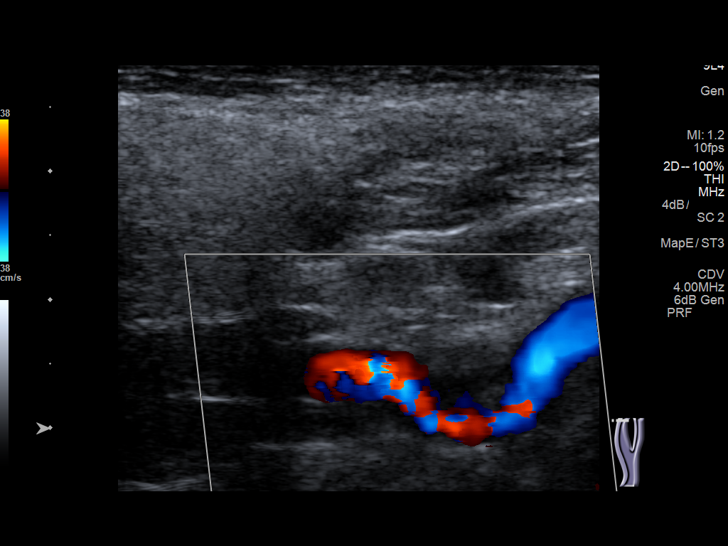
[im 37/70]
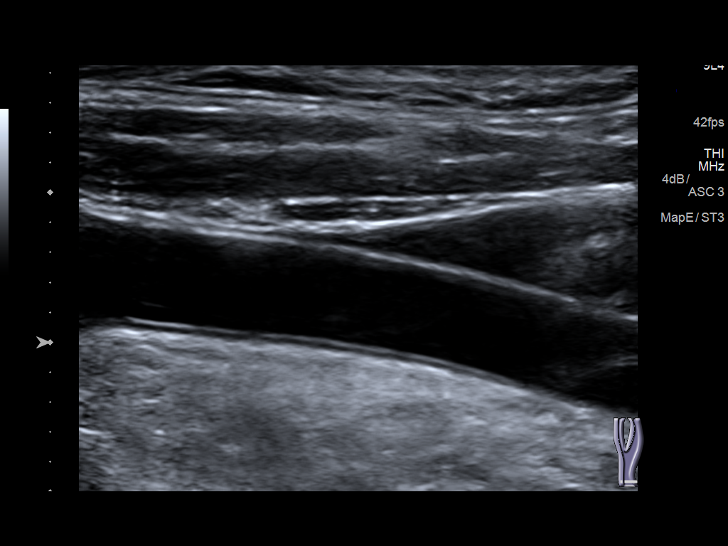
[im 40/70]
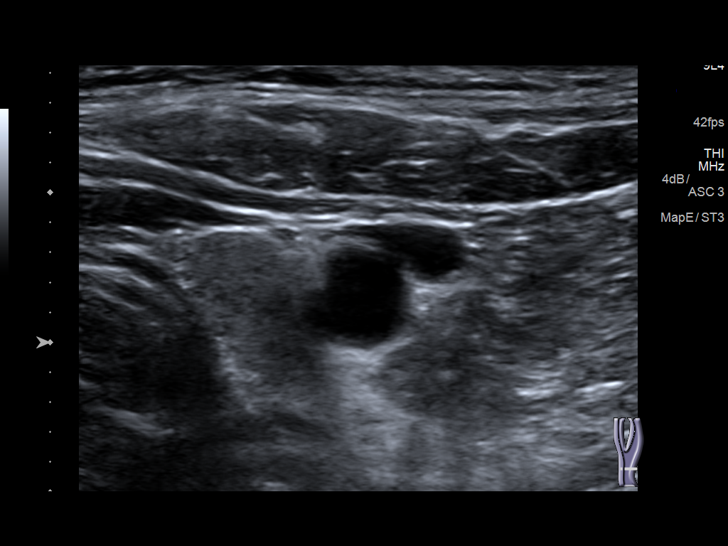
[im 46/70]
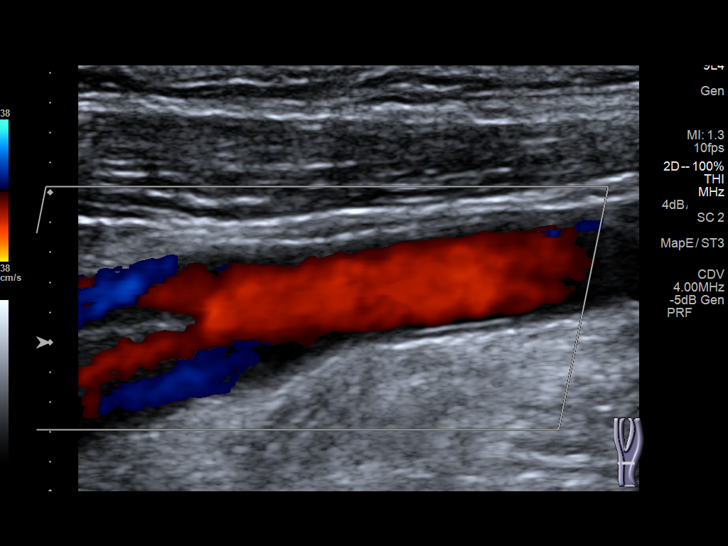
[im 52/70]
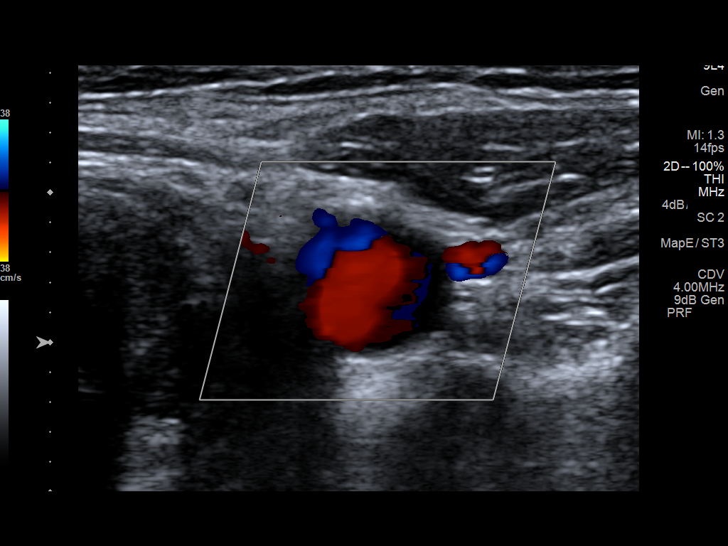
[im 58/70]
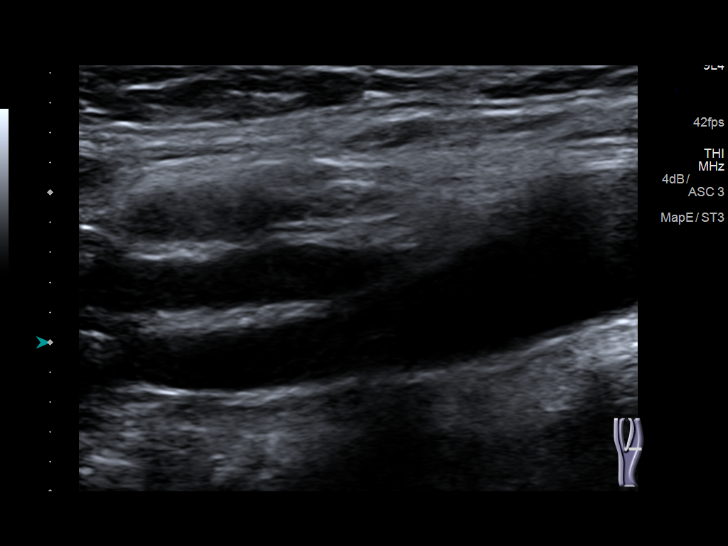
[im 64/70]
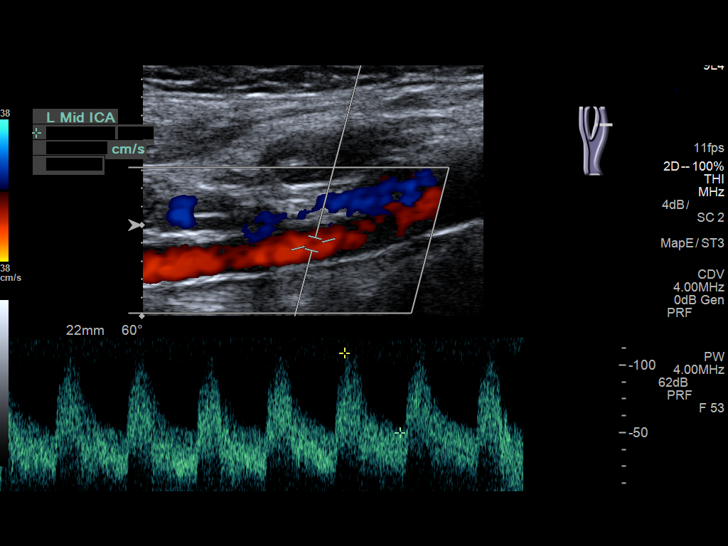
[im 70/70]
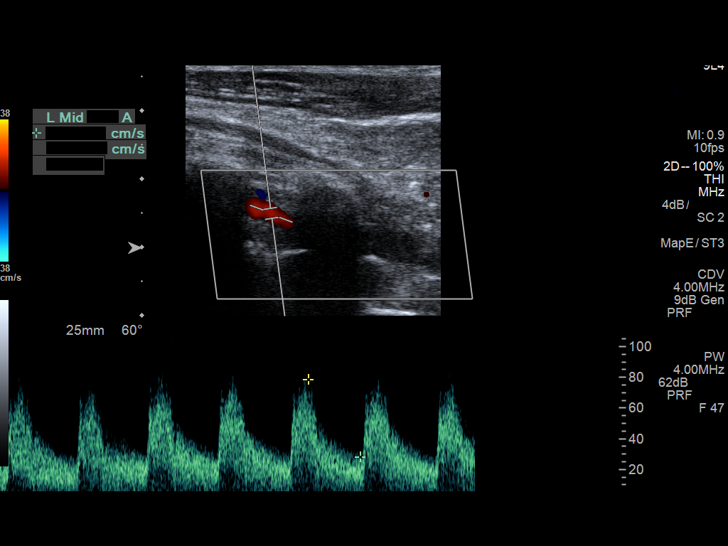

[13 of 24 positions shown; findings below may reference images not displayed]

FINDINGS: Criteria: Quantification of carotid stenosis is based on velocity
parameters that correlate the residual internal carotid diameter
with NASCET-based stenosis levels, using the diameter of the distal
internal carotid lumen as the denominator for stenosis measurement.

The following velocity measurements were obtained:

RIGHT

ICA:  Systolic 213 cm/sec, Diastolic 63 cm/sec

CCA:  103 cm/sec

SYSTOLIC ICA/CCA RATIO:

ECA:  61 cm/sec

LEFT

ICA:  Systolic 127 cm/sec, Diastolic 47 cm/sec

CCA:  97 cm/sec

SYSTOLIC ICA/CCA RATIO:

ECA:  81 cm/sec

Right Brachial SBP: Not acquired

Left Brachial SBP: Not acquired

RIGHT CAROTID ARTERY: No significant calcified disease of the right
common carotid artery. Intermediate waveform maintained.
Heterogeneous plaque without significant calcifications at the right
carotid bifurcation. Low resistance waveform of the right ICA. No
significant tortuosity.

RIGHT VERTEBRAL ARTERY: Antegrade flow with low resistance waveform.

LEFT CAROTID ARTERY: No significant calcified disease of the left
common carotid artery. Intermediate waveform maintained.
Heterogeneous plaque at the left carotid bifurcation without
significant calcifications. Low resistance waveform of the left ICA.

LEFT VERTEBRAL ARTERY:  Antegrade flow with low resistance waveform.
IMPRESSION: Right:

Heterogeneous plaque at the right carotid bifurcation contributes to
50%-69% stenosis by established duplex criteria.

Left:

Color duplex indicates moderate homogeneous plaque, with no
hemodynamically significant stenosis by duplex criteria in the left
extracranial cerebrovascular circulation.

## 2020-01-07 DIAGNOSIS — H1045 Other chronic allergic conjunctivitis: Secondary | ICD-10-CM | POA: Diagnosis not present

## 2020-03-31 DIAGNOSIS — D485 Neoplasm of uncertain behavior of skin: Secondary | ICD-10-CM | POA: Diagnosis not present

## 2020-03-31 DIAGNOSIS — M6748 Ganglion, other site: Secondary | ICD-10-CM | POA: Diagnosis not present

## 2020-05-26 DIAGNOSIS — F411 Generalized anxiety disorder: Secondary | ICD-10-CM | POA: Diagnosis not present

## 2020-05-26 DIAGNOSIS — F321 Major depressive disorder, single episode, moderate: Secondary | ICD-10-CM | POA: Diagnosis not present

## 2020-06-23 DIAGNOSIS — F411 Generalized anxiety disorder: Secondary | ICD-10-CM | POA: Diagnosis not present

## 2020-06-23 DIAGNOSIS — F321 Major depressive disorder, single episode, moderate: Secondary | ICD-10-CM | POA: Diagnosis not present

## 2020-06-23 DIAGNOSIS — M85432 Solitary bone cyst, left ulna and radius: Secondary | ICD-10-CM | POA: Diagnosis not present

## 2020-08-11 DIAGNOSIS — M25532 Pain in left wrist: Secondary | ICD-10-CM | POA: Diagnosis not present

## 2020-08-19 DIAGNOSIS — L03114 Cellulitis of left upper limb: Secondary | ICD-10-CM | POA: Diagnosis not present

## 2020-08-19 DIAGNOSIS — M85432 Solitary bone cyst, left ulna and radius: Secondary | ICD-10-CM | POA: Diagnosis not present

## 2020-08-19 DIAGNOSIS — E559 Vitamin D deficiency, unspecified: Secondary | ICD-10-CM | POA: Diagnosis not present

## 2020-08-19 DIAGNOSIS — J4521 Mild intermittent asthma with (acute) exacerbation: Secondary | ICD-10-CM | POA: Diagnosis not present

## 2020-08-19 DIAGNOSIS — D519 Vitamin B12 deficiency anemia, unspecified: Secondary | ICD-10-CM | POA: Diagnosis not present

## 2020-08-19 DIAGNOSIS — J309 Allergic rhinitis, unspecified: Secondary | ICD-10-CM | POA: Diagnosis not present

## 2020-08-25 DIAGNOSIS — R7301 Impaired fasting glucose: Secondary | ICD-10-CM | POA: Diagnosis not present

## 2020-08-25 DIAGNOSIS — I10 Essential (primary) hypertension: Secondary | ICD-10-CM | POA: Diagnosis not present

## 2020-08-25 DIAGNOSIS — D649 Anemia, unspecified: Secondary | ICD-10-CM | POA: Diagnosis not present

## 2020-08-25 DIAGNOSIS — J45909 Unspecified asthma, uncomplicated: Secondary | ICD-10-CM | POA: Diagnosis not present

## 2020-09-03 DIAGNOSIS — M25532 Pain in left wrist: Secondary | ICD-10-CM | POA: Diagnosis not present

## 2020-09-08 DIAGNOSIS — M79632 Pain in left forearm: Secondary | ICD-10-CM | POA: Diagnosis not present

## 2020-09-17 DIAGNOSIS — M25532 Pain in left wrist: Secondary | ICD-10-CM | POA: Diagnosis not present

## 2020-09-29 DIAGNOSIS — J069 Acute upper respiratory infection, unspecified: Secondary | ICD-10-CM | POA: Diagnosis not present

## 2020-09-29 DIAGNOSIS — Z20822 Contact with and (suspected) exposure to covid-19: Secondary | ICD-10-CM | POA: Diagnosis not present

## 2021-01-24 DIAGNOSIS — E559 Vitamin D deficiency, unspecified: Secondary | ICD-10-CM | POA: Diagnosis not present

## 2021-01-24 DIAGNOSIS — I10 Essential (primary) hypertension: Secondary | ICD-10-CM | POA: Diagnosis not present

## 2021-01-24 DIAGNOSIS — R7301 Impaired fasting glucose: Secondary | ICD-10-CM | POA: Diagnosis not present

## 2021-01-26 DIAGNOSIS — I10 Essential (primary) hypertension: Secondary | ICD-10-CM | POA: Diagnosis not present

## 2021-01-26 DIAGNOSIS — R7301 Impaired fasting glucose: Secondary | ICD-10-CM | POA: Diagnosis not present

## 2021-01-26 DIAGNOSIS — E782 Mixed hyperlipidemia: Secondary | ICD-10-CM | POA: Diagnosis not present

## 2021-01-26 DIAGNOSIS — J45909 Unspecified asthma, uncomplicated: Secondary | ICD-10-CM | POA: Diagnosis not present

## 2021-04-20 DIAGNOSIS — Z6834 Body mass index (BMI) 34.0-34.9, adult: Secondary | ICD-10-CM | POA: Diagnosis not present

## 2021-04-20 DIAGNOSIS — N9089 Other specified noninflammatory disorders of vulva and perineum: Secondary | ICD-10-CM | POA: Diagnosis not present

## 2021-04-20 DIAGNOSIS — N951 Menopausal and female climacteric states: Secondary | ICD-10-CM | POA: Diagnosis not present

## 2021-07-11 DIAGNOSIS — R7301 Impaired fasting glucose: Secondary | ICD-10-CM | POA: Diagnosis not present

## 2021-07-11 DIAGNOSIS — E782 Mixed hyperlipidemia: Secondary | ICD-10-CM | POA: Diagnosis not present

## 2021-07-26 DIAGNOSIS — I10 Essential (primary) hypertension: Secondary | ICD-10-CM | POA: Diagnosis not present

## 2021-07-26 DIAGNOSIS — J45909 Unspecified asthma, uncomplicated: Secondary | ICD-10-CM | POA: Diagnosis not present

## 2021-07-26 DIAGNOSIS — E782 Mixed hyperlipidemia: Secondary | ICD-10-CM | POA: Diagnosis not present

## 2021-07-26 DIAGNOSIS — D649 Anemia, unspecified: Secondary | ICD-10-CM | POA: Diagnosis not present

## 2021-08-12 ENCOUNTER — Ambulatory Visit: Payer: BC Managed Care – PPO | Admitting: Allergy & Immunology

## 2022-06-30 DIAGNOSIS — Z0001 Encounter for general adult medical examination with abnormal findings: Secondary | ICD-10-CM | POA: Diagnosis not present

## 2022-06-30 DIAGNOSIS — E781 Pure hyperglyceridemia: Secondary | ICD-10-CM | POA: Diagnosis not present

## 2022-06-30 DIAGNOSIS — Z13220 Encounter for screening for lipoid disorders: Secondary | ICD-10-CM | POA: Diagnosis not present

## 2022-07-13 DIAGNOSIS — Z0001 Encounter for general adult medical examination with abnormal findings: Secondary | ICD-10-CM | POA: Diagnosis not present

## 2022-07-24 DIAGNOSIS — Z1231 Encounter for screening mammogram for malignant neoplasm of breast: Secondary | ICD-10-CM | POA: Diagnosis not present

## 2023-12-11 ENCOUNTER — Other Ambulatory Visit (HOSPITAL_BASED_OUTPATIENT_CLINIC_OR_DEPARTMENT_OTHER): Payer: Self-pay

## 2023-12-11 MED ORDER — AMOXICILLIN-POT CLAVULANATE 875-125 MG PO TABS
1.0000 | ORAL_TABLET | Freq: Two times a day (BID) | ORAL | 0 refills | Status: AC
  Filled 2023-12-11: qty 14, 7d supply, fill #0

## 2023-12-18 ENCOUNTER — Other Ambulatory Visit (HOSPITAL_BASED_OUTPATIENT_CLINIC_OR_DEPARTMENT_OTHER): Payer: Self-pay

## 2023-12-18 MED ORDER — RIZATRIPTAN BENZOATE 5 MG PO TBDP
ORAL_TABLET | ORAL | 5 refills | Status: AC
  Filled 2023-12-18: qty 18, 21d supply, fill #0

## 2023-12-18 MED ORDER — ROSUVASTATIN CALCIUM 20 MG PO TABS
20.0000 mg | ORAL_TABLET | ORAL | 1 refills | Status: AC
  Filled 2023-12-18 – 2024-03-01 (×2): qty 45, 90d supply, fill #0

## 2023-12-18 MED ORDER — FLORAJEN DIGESTION PO CAPS: ORAL_CAPSULE | ORAL | 0 refills | Status: AC

## 2023-12-31 ENCOUNTER — Other Ambulatory Visit (HOSPITAL_BASED_OUTPATIENT_CLINIC_OR_DEPARTMENT_OTHER): Payer: Self-pay

## 2023-12-31 MED ORDER — MONTELUKAST SODIUM 10 MG PO TABS
10.0000 mg | ORAL_TABLET | Freq: Every evening | ORAL | 2 refills | Status: AC
  Filled 2023-12-31 – 2024-03-24 (×2): qty 90, 90d supply, fill #0

## 2024-01-14 DIAGNOSIS — H524 Presbyopia: Secondary | ICD-10-CM | POA: Diagnosis not present

## 2024-01-14 DIAGNOSIS — H52223 Regular astigmatism, bilateral: Secondary | ICD-10-CM | POA: Diagnosis not present

## 2024-01-14 DIAGNOSIS — H5203 Hypermetropia, bilateral: Secondary | ICD-10-CM | POA: Diagnosis not present

## 2024-01-29 ENCOUNTER — Other Ambulatory Visit (HOSPITAL_COMMUNITY): Payer: Self-pay

## 2024-01-29 ENCOUNTER — Other Ambulatory Visit (HOSPITAL_BASED_OUTPATIENT_CLINIC_OR_DEPARTMENT_OTHER): Payer: Self-pay

## 2024-01-29 MED ORDER — IBUPROFEN 800 MG PO TABS
800.0000 mg | ORAL_TABLET | Freq: Three times a day (TID) | ORAL | 0 refills | Status: AC | PRN
  Filled 2024-01-29: qty 90, 30d supply, fill #0

## 2024-01-30 ENCOUNTER — Other Ambulatory Visit (HOSPITAL_COMMUNITY): Payer: Self-pay

## 2024-03-01 ENCOUNTER — Other Ambulatory Visit (HOSPITAL_COMMUNITY): Payer: Self-pay

## 2024-03-07 ENCOUNTER — Other Ambulatory Visit (HOSPITAL_BASED_OUTPATIENT_CLINIC_OR_DEPARTMENT_OTHER): Payer: Self-pay

## 2024-03-11 ENCOUNTER — Other Ambulatory Visit (HOSPITAL_BASED_OUTPATIENT_CLINIC_OR_DEPARTMENT_OTHER): Payer: Self-pay

## 2024-03-24 ENCOUNTER — Other Ambulatory Visit (HOSPITAL_COMMUNITY): Payer: Self-pay
